# Patient Record
Sex: Female | Born: 1950 | Race: White | Hispanic: No | Smoking: Former smoker
Health system: Southern US, Community
[De-identification: ages and names within clinical notes are randomized; demographics above are authoritative.]

## PROBLEM LIST (undated history)

## (undated) DIAGNOSIS — D126 Benign neoplasm of colon, unspecified: Secondary | ICD-10-CM

## (undated) DIAGNOSIS — E559 Vitamin D deficiency, unspecified: Secondary | ICD-10-CM

## (undated) DIAGNOSIS — E785 Hyperlipidemia, unspecified: Secondary | ICD-10-CM

## (undated) DIAGNOSIS — I10 Essential (primary) hypertension: Secondary | ICD-10-CM

## (undated) HISTORY — DX: Benign neoplasm of colon, unspecified: D12.6

## (undated) HISTORY — DX: Essential (primary) hypertension: I10

## (undated) HISTORY — DX: Vitamin D deficiency, unspecified: E55.9

## (undated) HISTORY — DX: Hyperlipidemia, unspecified: E78.5

## (undated) HISTORY — PX: TUBAL LIGATION: SHX77

## (undated) HISTORY — PX: BREAST BIOPSY: SHX20

---

## 1997-12-11 ENCOUNTER — Ambulatory Visit (HOSPITAL_COMMUNITY): Admission: RE | Admit: 1997-12-11 | Discharge: 1997-12-11 | Payer: Self-pay | Admitting: Obstetrics and Gynecology

## 1999-01-28 ENCOUNTER — Ambulatory Visit (HOSPITAL_COMMUNITY): Admission: RE | Admit: 1999-01-28 | Discharge: 1999-01-28 | Payer: Self-pay | Admitting: Obstetrics and Gynecology

## 1999-03-18 ENCOUNTER — Other Ambulatory Visit: Admission: RE | Admit: 1999-03-18 | Discharge: 1999-03-18 | Payer: Self-pay | Admitting: Obstetrics and Gynecology

## 2000-02-02 ENCOUNTER — Encounter: Payer: Self-pay | Admitting: Obstetrics and Gynecology

## 2000-02-02 ENCOUNTER — Ambulatory Visit (HOSPITAL_COMMUNITY): Admission: RE | Admit: 2000-02-02 | Discharge: 2000-02-02 | Payer: Self-pay | Admitting: Obstetrics and Gynecology

## 2000-03-23 ENCOUNTER — Other Ambulatory Visit: Admission: RE | Admit: 2000-03-23 | Discharge: 2000-03-23 | Payer: Self-pay | Admitting: Obstetrics and Gynecology

## 2001-02-08 ENCOUNTER — Encounter: Payer: Self-pay | Admitting: Obstetrics and Gynecology

## 2001-02-08 ENCOUNTER — Ambulatory Visit (HOSPITAL_COMMUNITY): Admission: RE | Admit: 2001-02-08 | Discharge: 2001-02-08 | Payer: Self-pay | Admitting: Obstetrics and Gynecology

## 2001-04-05 ENCOUNTER — Other Ambulatory Visit: Admission: RE | Admit: 2001-04-05 | Discharge: 2001-04-05 | Payer: Self-pay | Admitting: Obstetrics and Gynecology

## 2001-09-05 ENCOUNTER — Emergency Department (HOSPITAL_COMMUNITY): Admission: EM | Admit: 2001-09-05 | Discharge: 2001-09-05 | Payer: Self-pay | Admitting: Emergency Medicine

## 2001-09-05 ENCOUNTER — Encounter: Payer: Self-pay | Admitting: Emergency Medicine

## 2002-03-14 ENCOUNTER — Ambulatory Visit (HOSPITAL_COMMUNITY): Admission: RE | Admit: 2002-03-14 | Discharge: 2002-03-14 | Payer: Self-pay | Admitting: Obstetrics and Gynecology

## 2002-03-14 ENCOUNTER — Encounter: Payer: Self-pay | Admitting: Obstetrics and Gynecology

## 2002-04-11 ENCOUNTER — Other Ambulatory Visit: Admission: RE | Admit: 2002-04-11 | Discharge: 2002-04-11 | Payer: Self-pay | Admitting: Obstetrics and Gynecology

## 2002-08-07 ENCOUNTER — Ambulatory Visit (HOSPITAL_COMMUNITY): Admission: RE | Admit: 2002-08-07 | Discharge: 2002-08-07 | Payer: Self-pay | Admitting: Gastroenterology

## 2002-08-07 ENCOUNTER — Encounter (INDEPENDENT_AMBULATORY_CARE_PROVIDER_SITE_OTHER): Payer: Self-pay | Admitting: Specialist

## 2003-05-21 ENCOUNTER — Encounter: Payer: Self-pay | Admitting: Obstetrics and Gynecology

## 2003-05-21 ENCOUNTER — Ambulatory Visit (HOSPITAL_COMMUNITY): Admission: RE | Admit: 2003-05-21 | Discharge: 2003-05-21 | Payer: Self-pay | Admitting: Obstetrics and Gynecology

## 2003-05-22 ENCOUNTER — Other Ambulatory Visit: Admission: RE | Admit: 2003-05-22 | Discharge: 2003-05-22 | Payer: Self-pay | Admitting: Obstetrics and Gynecology

## 2003-07-02 ENCOUNTER — Encounter: Admission: RE | Admit: 2003-07-02 | Discharge: 2003-07-02 | Payer: Self-pay | Admitting: Obstetrics and Gynecology

## 2003-07-02 ENCOUNTER — Encounter: Payer: Self-pay | Admitting: Obstetrics and Gynecology

## 2004-06-03 ENCOUNTER — Ambulatory Visit (HOSPITAL_COMMUNITY): Admission: RE | Admit: 2004-06-03 | Discharge: 2004-06-03 | Payer: Self-pay | Admitting: Obstetrics and Gynecology

## 2004-09-23 ENCOUNTER — Other Ambulatory Visit: Admission: RE | Admit: 2004-09-23 | Discharge: 2004-09-23 | Payer: Self-pay | Admitting: Obstetrics and Gynecology

## 2005-06-29 ENCOUNTER — Ambulatory Visit (HOSPITAL_COMMUNITY): Admission: RE | Admit: 2005-06-29 | Discharge: 2005-06-29 | Payer: Self-pay | Admitting: *Deleted

## 2006-02-16 ENCOUNTER — Other Ambulatory Visit: Admission: RE | Admit: 2006-02-16 | Discharge: 2006-02-16 | Payer: Self-pay | Admitting: Obstetrics & Gynecology

## 2006-07-12 ENCOUNTER — Encounter: Admission: RE | Admit: 2006-07-12 | Discharge: 2006-07-12 | Payer: Self-pay | Admitting: Obstetrics and Gynecology

## 2007-03-15 ENCOUNTER — Other Ambulatory Visit: Admission: RE | Admit: 2007-03-15 | Discharge: 2007-03-15 | Payer: Self-pay | Admitting: Obstetrics & Gynecology

## 2007-07-18 ENCOUNTER — Encounter: Admission: RE | Admit: 2007-07-18 | Discharge: 2007-07-18 | Payer: Self-pay | Admitting: Obstetrics and Gynecology

## 2008-05-15 ENCOUNTER — Other Ambulatory Visit: Admission: RE | Admit: 2008-05-15 | Discharge: 2008-05-15 | Payer: Self-pay | Admitting: Obstetrics and Gynecology

## 2008-07-23 ENCOUNTER — Encounter: Admission: RE | Admit: 2008-07-23 | Discharge: 2008-07-23 | Payer: Self-pay | Admitting: Obstetrics and Gynecology

## 2009-07-29 ENCOUNTER — Ambulatory Visit (HOSPITAL_COMMUNITY): Admission: RE | Admit: 2009-07-29 | Discharge: 2009-07-29 | Payer: Self-pay | Admitting: Obstetrics and Gynecology

## 2010-08-04 ENCOUNTER — Ambulatory Visit (HOSPITAL_COMMUNITY): Admission: RE | Admit: 2010-08-04 | Discharge: 2010-08-04 | Payer: Self-pay | Admitting: Obstetrics and Gynecology

## 2011-02-23 ENCOUNTER — Other Ambulatory Visit: Payer: Self-pay | Admitting: Obstetrics and Gynecology

## 2011-02-23 DIAGNOSIS — Z780 Asymptomatic menopausal state: Secondary | ICD-10-CM

## 2011-03-10 ENCOUNTER — Ambulatory Visit (HOSPITAL_COMMUNITY)
Admission: RE | Admit: 2011-03-10 | Discharge: 2011-03-10 | Disposition: A | Discharge status: Home / Self Care | Payer: BC Managed Care – PPO | Source: Ambulatory Visit | Attending: Obstetrics and Gynecology | Admitting: Obstetrics and Gynecology

## 2011-03-10 ENCOUNTER — Ambulatory Visit (HOSPITAL_COMMUNITY): Payer: BC Managed Care – PPO

## 2011-03-10 DIAGNOSIS — Z13820 Encounter for screening for osteoporosis: Secondary | ICD-10-CM | POA: Insufficient documentation

## 2011-03-10 DIAGNOSIS — Z780 Asymptomatic menopausal state: Secondary | ICD-10-CM | POA: Insufficient documentation

## 2011-03-10 LAB — HM DEXA SCAN

## 2011-03-24 NOTE — Op Note (Signed)
   NAME:  Brittany Ward, Brittany Ward                       ACCOUNT NO.:  [id number]   MEDICAL RECORD NO.:  [id number]                    PATIENT TYPE:   LOCATION:                                       FACILITY:   PHYSICIAN:  Anselmo Rod, M.D.               DATE OF BIRTH:  1951-01-22   DATE OF PROCEDURE:  08/07/2002  DATE OF DISCHARGE:                                 OPERATIVE REPORT   PROCEDURE PERFORMED:  Colonoscopy with snare polypectomy x1.   ENDOSCOPIST:  Anselmo Rod, M.D.   INSTRUMENT USED:  Olympus video colonoscope.   INDICATIONS FOR PROCEDURE:  A 61 year old white female underwent screening  colonoscopy.  The patient's mother had colon cancer.  Rule out colonic  polyps, masses, hemorrhoids, etc.   PREPROCEDURE PREPARATION:  Informed consent was received from the patient.  The patient was fasting eight hours prior to the procedure and prepped with  a bottle of magnesium citrate and a gallon of NuLytely drank prior to the  procedure.   PREPROCEDURE PHYSICAL:  VITAL SIGNS:  The patient had stable vital signs.  NECK:  Supple.  CHEST:  Clear to auscultation.  HEART:  S1, S2.  ABDOMEN:  Soft with normal bowel sounds.   DESCRIPTION OF PROCEDURE:  The patient was placed in left lateral decubitus  position and sedated with 60 mg of Demerol and 6 mg of Versed intravenously.  Once the patient was adequately sedated and maintained on low flow oxygen  and continuous cardiac monitoring, the Olympus video colonoscope was  advanced from the rectum to the cecum and terminal ileum without difficulty.  Two small sessile polyps were biopsied at 20 cm; a 5 to 6 mm polyp was  snared at 20 cm at well.  The rest of colonic mucosa to terminal ileum  appeared normal.   IMPRESSION:  1. Three colonic polyps removed.  See description above.  2. Normal-appearing proximal left colon, transverse colon, right colon,     cecum and terminal ileum.   RECOMMENDATIONS:  1. Avoid all nonsteroidals  including aspirin for now.  2.     Await pathology results.  3. Repeat colorectal cancer screening depending on the pathology results.  4. Outpatient followup in the next 2 weeks, earlier if need be.                                               Anselmo Rod, M.D.    JNM/MEDQ  D:  08/07/2002  T:  08/09/2002  Job:  [id number]   cc:   Laqueta Linden, M.D.  [address], [address]  [place]  [place] [address]  Fax: [phone]

## 2011-08-16 ENCOUNTER — Other Ambulatory Visit: Payer: Self-pay | Admitting: Obstetrics and Gynecology

## 2011-08-16 DIAGNOSIS — Z1231 Encounter for screening mammogram for malignant neoplasm of breast: Secondary | ICD-10-CM

## 2011-09-01 ENCOUNTER — Ambulatory Visit (HOSPITAL_COMMUNITY)
Admission: RE | Admit: 2011-09-01 | Discharge: 2011-09-01 | Disposition: A | Discharge status: Home / Self Care | Payer: BC Managed Care – PPO | Source: Ambulatory Visit | Attending: Obstetrics and Gynecology | Admitting: Obstetrics and Gynecology

## 2011-09-01 DIAGNOSIS — Z1231 Encounter for screening mammogram for malignant neoplasm of breast: Secondary | ICD-10-CM

## 2011-11-24 LAB — HM PAP SMEAR: HM Pap smear: NEGATIVE

## 2012-09-11 ENCOUNTER — Other Ambulatory Visit: Payer: Self-pay | Admitting: Obstetrics and Gynecology

## 2012-09-11 DIAGNOSIS — Z1231 Encounter for screening mammogram for malignant neoplasm of breast: Secondary | ICD-10-CM

## 2012-10-04 ENCOUNTER — Ambulatory Visit (HOSPITAL_COMMUNITY)
Admission: RE | Admit: 2012-10-04 | Discharge: 2012-10-04 | Disposition: A | Discharge status: Home / Self Care | Payer: BC Managed Care – PPO | Source: Ambulatory Visit | Attending: Obstetrics and Gynecology | Admitting: Obstetrics and Gynecology

## 2012-10-04 DIAGNOSIS — Z1231 Encounter for screening mammogram for malignant neoplasm of breast: Secondary | ICD-10-CM | POA: Insufficient documentation

## 2013-10-01 ENCOUNTER — Other Ambulatory Visit (HOSPITAL_COMMUNITY): Payer: Self-pay | Admitting: Obstetrics and Gynecology

## 2013-10-01 DIAGNOSIS — Z1231 Encounter for screening mammogram for malignant neoplasm of breast: Secondary | ICD-10-CM

## 2013-10-16 ENCOUNTER — Ambulatory Visit (HOSPITAL_COMMUNITY)
Admission: RE | Admit: 2013-10-16 | Discharge: 2013-10-16 | Disposition: A | Discharge status: Home / Self Care | Payer: BC Managed Care – PPO | Source: Ambulatory Visit | Attending: Obstetrics and Gynecology | Admitting: Obstetrics and Gynecology

## 2013-10-16 DIAGNOSIS — Z1231 Encounter for screening mammogram for malignant neoplasm of breast: Secondary | ICD-10-CM

## 2013-12-04 ENCOUNTER — Encounter: Payer: Self-pay | Admitting: Nurse Practitioner

## 2013-12-05 ENCOUNTER — Encounter: Payer: Self-pay | Admitting: Nurse Practitioner

## 2013-12-05 ENCOUNTER — Ambulatory Visit (INDEPENDENT_AMBULATORY_CARE_PROVIDER_SITE_OTHER): Payer: BC Managed Care – PPO | Admitting: Nurse Practitioner

## 2013-12-05 VITALS — BP 134/86 | HR 84 | Ht 63.75 in | Wt 219.0 lb

## 2013-12-05 DIAGNOSIS — F4321 Adjustment disorder with depressed mood: Secondary | ICD-10-CM | POA: Insufficient documentation

## 2013-12-05 DIAGNOSIS — Z01419 Encounter for gynecological examination (general) (routine) without abnormal findings: Secondary | ICD-10-CM

## 2013-12-05 DIAGNOSIS — N952 Postmenopausal atrophic vaginitis: Secondary | ICD-10-CM

## 2013-12-05 DIAGNOSIS — E559 Vitamin D deficiency, unspecified: Secondary | ICD-10-CM | POA: Insufficient documentation

## 2013-12-05 DIAGNOSIS — I10 Essential (primary) hypertension: Secondary | ICD-10-CM | POA: Insufficient documentation

## 2013-12-05 DIAGNOSIS — Z800 Family history of malignant neoplasm of digestive organs: Secondary | ICD-10-CM

## 2013-12-05 MED ORDER — ESTRADIOL 2 MG VA RING: 2.0000 mg | VAGINAL_RING | VAGINAL | Status: DC

## 2013-12-05 MED ORDER — VITAMIN D (ERGOCALCIFEROL) 1.25 MG (50000 UNIT) PO CAPS: 50000.0000 [IU] | ORAL_CAPSULE | ORAL | Status: DC

## 2013-12-05 NOTE — Patient Instructions (Signed)

## 2013-12-05 NOTE — Progress Notes (Signed)
Reviewed personally.  M. Suzanne Abad Manard, MD.  

## 2013-12-05 NOTE — Progress Notes (Signed)
Patient ID: Brittany Ward, female   DOB: [birth year], 63 y.o.   MRN: [id number] 63 y.o. G2P2002 Divorced Caucasian Fe here for annual exam.  She is still suffering from the loss of her partner of 20 years who died of liver disease in 08-09-2012.  Still using Estring and doing well.  Has a new PCP since Sima Matas PA has retired and will be getting labs there.  Patient's last menstrual period was 02/10/1997.          Sexually active: no  The current method of family planning is none - postmenopausal Exercising: no  The patient does not participate in regular exercise at present. Smoker:  Yes, 1/2 ppd  Health Maintenance: Pap:  11/24/11, WNL, neg HR HPV MMG:  10/16/13, Bi-Rads 1: negative Colonoscopy:  11/2008, polyp, repeat in 5 years BMD:  03/2011, -0.7/-1.4/-1.5 TDaP:  04/25/06 Labs:  PCP Urine:  PCP    reports that she has been smoking Cigarettes.  She has been smoking about 0.50 packs per day. She has never used smokeless tobacco. She reports that she drinks about 0.5 ounces of alcohol per week. She reports that she does not use illicit drugs.  Past Medical History  Diagnosis Date  . Vitamin D deficiency   . Hypertension   . Hyperlipidemia     Past Surgical History  Procedure Laterality Date  . Cesarean section      x2    Current Outpatient Prescriptions  Medication Sig Dispense Refill  . aspirin EC 81 MG tablet Take 81 mg by mouth daily.      . Calcium Carbonate-Vit D-Min (CALCIUM 1200 PO) Take 1 tablet by mouth daily.      Marland Kitchen desipramine (NORPRAMIN) 50 MG tablet Take 50 mg by mouth daily.      Marland Kitchen esomeprazole (NEXIUM) 40 MG capsule Take 40 mg by mouth daily at 12 noon.      Marland Kitchen estradiol (ESTRING) 2 MG vaginal ring Place 2 mg vaginally every 3 (three) months. follow package directions  1 each  3  . ezetimibe (ZETIA) 10 MG tablet Take 10 mg by mouth daily.      . hydrochlorothiazide (HYDRODIURIL) 25 MG tablet Take 25 mg by mouth daily.      Marland Kitchen losartan (COZAAR) 50 MG tablet  Take 50 mg by mouth daily.      . Multiple Vitamin (MULTIVITAMIN) tablet Take 1 tablet by mouth daily.      . Vitamin D, Ergocalciferol, (DRISDOL) 50000 UNITS CAPS capsule Take 1 capsule (50,000 Units total) by mouth every 14 (fourteen) days.  30 capsule  3   No current facility-administered medications for this visit.    Family History  Problem Relation Age of Onset  . Colon cancer Mother   . Pancreatic cancer Brother   . Leukemia Maternal Grandfather   . Breast cancer Cousin     ROS:  Pertinent items are noted in HPI.  Otherwise, a comprehensive ROS was negative.  Exam:   BP 134/86  Pulse 84  Ht 5' 3.75" (1.619 m)  Wt 219 lb (99.338 kg)  BMI 37.90 kg/m2  LMP 02/10/1997 Height: 5' 3.75" (161.9 cm)  Ht Readings from Last 3 Encounters:  12/05/13 5' 3.75" (1.619 m)    General appearance: alert, cooperative and appears stated age Head: Normocephalic, without obvious abnormality, atraumatic Neck: no adenopathy, supple, symmetrical, trachea midline and thyroid normal to inspection and palpation Lungs: clear to auscultation bilaterally Breasts: normal appearance, no masses or tenderness Heart: regular  rate and rhythm Abdomen: soft, non-tender; no masses,  no organomegaly Extremities: extremities normal, atraumatic, no cyanosis or edema Skin: Skin color, texture, turgor normal. No rashes or lesions Lymph nodes: Cervical, supraclavicular, and axillary nodes normal. No abnormal inguinal nodes palpated Neurologic: Grossly normal   Pelvic: External genitalia:  no lesions              Urethra:  normal appearing urethra with no masses, tenderness or lesions              Bartholin's and Skene's: normal                 Vagina: normal appearing vagina with normal color and discharge, no lesions              Cervix: anteverted              Pap taken: no Bimanual Exam:  Uterus:  normal size, contour, position, consistency, mobility, non-tender              Adnexa: no mass, fullness,  tenderness               Rectovaginal: Confirms               Anus:  normal sphincter tone, no lesions  A:  Well Woman with normal exam  Postmenopausal - off HRT 08/2001  HTN  [place]: colon and pancreatic cancer  P:   Pap smear as per guidelines   Mammogram is due 12/15  Refill on Estring for a year - she is aware of potential risk of Estring with DVT, CVA,cancer, etc.  Counseled with exercise, diet, calcium, vit D and staying healthy.  Encourage outside activities and stay connected with family. return annually or prn  An After Visit Summary was printed and given to the patient.

## 2013-12-06 LAB — VITAMIN D 25 HYDROXY (VIT D DEFICIENCY, FRACTURES): VIT D 25 HYDROXY: 55 ng/mL (ref 30–89)

## 2013-12-08 ENCOUNTER — Telehealth: Payer: Self-pay | Admitting: *Deleted

## 2013-12-08 NOTE — Telephone Encounter (Signed)
I have attempted to contact this patient by phone with the following results: left message to return my call on answering machine (home per [facility]).

## 2013-12-08 NOTE — Telephone Encounter (Signed)
Message copied by Graylon Good on Mon Dec 08, 2013 11:40 AM ------      Message from: Kem Boroughs R      Created: Mon Dec 08, 2013  9:14 AM       Let patient know results and follow protocol.  By mistake she was on Vit D weekly instead of every 2 weeks. She can go back to every other week.  On OTC her Vit D goes down. ------

## 2013-12-09 NOTE — Telephone Encounter (Signed)
I have attempted to contact this patient by phone with the following results: left message to return my call on answering machine (home per [facility]).  

## 2013-12-10 NOTE — Telephone Encounter (Signed)
Pt is returning a call to [place]. Pt states she will call back tomorrow tom talk with Colletta [place].

## 2013-12-11 NOTE — Telephone Encounter (Signed)
Patient is returning call to stephanie. 

## 2013-12-11 NOTE — Telephone Encounter (Signed)
Pt notified in result note.  

## 2014-05-01 ENCOUNTER — Other Ambulatory Visit: Payer: Self-pay | Admitting: Nurse Practitioner

## 2014-05-01 NOTE — Telephone Encounter (Signed)
Last AEX: 12/05/13 Last refill:12/05/13, # 26, 0 refills Current AEX: Filled until the pt next AEX is due  Closed encounter

## 2014-09-07 ENCOUNTER — Encounter: Payer: Self-pay | Admitting: Nurse Practitioner

## 2014-10-20 ENCOUNTER — Other Ambulatory Visit: Payer: Self-pay | Admitting: Obstetrics & Gynecology

## 2014-10-20 DIAGNOSIS — Z1231 Encounter for screening mammogram for malignant neoplasm of breast: Secondary | ICD-10-CM

## 2014-10-23 ENCOUNTER — Ambulatory Visit (HOSPITAL_COMMUNITY)
Admission: RE | Admit: 2014-10-23 | Discharge: 2014-10-23 | Disposition: A | Discharge status: Home / Self Care | Payer: BC Managed Care – PPO | Source: Ambulatory Visit | Attending: Obstetrics & Gynecology | Admitting: Obstetrics & Gynecology

## 2014-10-23 DIAGNOSIS — Z1231 Encounter for screening mammogram for malignant neoplasm of breast: Secondary | ICD-10-CM | POA: Diagnosis present

## 2014-12-07 ENCOUNTER — Other Ambulatory Visit: Payer: Self-pay | Admitting: Nurse Practitioner

## 2014-12-07 NOTE — Telephone Encounter (Signed)
Medication refill request: Vitamin D 50,000 Last AEX:  12/05/13 Next AEX: 12/25/14 Last MMG (if hormonal medication request): 10/23/14 BIRADS1:neg Refill authorized: 05/01/14 #26/0R. Today #26/0R?

## 2014-12-25 ENCOUNTER — Encounter: Payer: Self-pay | Admitting: Nurse Practitioner

## 2014-12-25 ENCOUNTER — Ambulatory Visit (INDEPENDENT_AMBULATORY_CARE_PROVIDER_SITE_OTHER): Payer: 59 | Admitting: Nurse Practitioner

## 2014-12-25 VITALS — BP 112/72 | HR 64 | Ht 64.0 in | Wt 220.0 lb

## 2014-12-25 DIAGNOSIS — Z0000 Encounter for general adult medical examination without abnormal findings: Secondary | ICD-10-CM

## 2014-12-25 DIAGNOSIS — M8580 Other specified disorders of bone density and structure, unspecified site: Secondary | ICD-10-CM

## 2014-12-25 DIAGNOSIS — E559 Vitamin D deficiency, unspecified: Secondary | ICD-10-CM

## 2014-12-25 DIAGNOSIS — Z01419 Encounter for gynecological examination (general) (routine) without abnormal findings: Secondary | ICD-10-CM

## 2014-12-25 LAB — HEPATIC FUNCTION PANEL
ALBUMIN: 4.5 g/dL (ref 3.5–5.2)
ALT: 27 U/L (ref 0–35)
AST: 25 U/L (ref 0–37)
Alkaline Phosphatase: 78 U/L (ref 39–117)
BILIRUBIN TOTAL: 0.4 mg/dL (ref 0.2–1.2)
Bilirubin, Direct: 0.1 mg/dL (ref 0.0–0.3)
Indirect Bilirubin: 0.3 mg/dL (ref 0.2–1.2)
Total Protein: 7.4 g/dL (ref 6.0–8.3)

## 2014-12-25 MED ORDER — ESTRADIOL 2 MG VA RING: 2.0000 mg | VAGINAL_RING | VAGINAL | Status: DC

## 2014-12-25 NOTE — Progress Notes (Signed)
Patient ID: Brittany Ward, female   DOB: 07/06/1951, 64 y.o.   MRN: [id number] 63 y.o. G2P2002 Divorced  Caucasian Fe here for annual exam.    Patient's last menstrual period was 02/10/1997.  Pt is post menopausal.  Sexually active: No.  The current method of family planning is none and post menopausal status.    Exercising: No.  The patient does not participate in regular exercise at present. Smoker:  Yes, 1/2 ppd  Health Maintenance: Pap:  11/24/11, negative with neg HR HPV MMG:  10/23/14, 3D, Bi-Rads 1:  Negative  Colonoscopy:  06/2014, normal, repeat in 10 years BMD:   03/10/11, -0.7/-1.4/-1.5 TDaP:  04/25/06 Labs:  PCP takes care of all labs   reports that she has been smoking Cigarettes.  She has been smoking about 0.50 packs per day. She has never used smokeless tobacco. She reports that she drinks about 0.5 - 1.0 oz of alcohol per week. She reports that she does not use illicit drugs.  Past Medical History  Diagnosis Date  . Vitamin D deficiency   . Hypertension   . Hyperlipidemia     Past Surgical History  Procedure Laterality Date  . Cesarean section      x2    Current Outpatient Prescriptions  Medication Sig Dispense Refill  . aspirin EC 81 MG tablet Take 81 mg by mouth daily.    . Calcium Carbonate-Vit D-Min (CALCIUM 1200 PO) Take 1 tablet by mouth daily.    Marland Kitchen desipramine (NORPRAMIN) 50 MG tablet Take 50 mg by mouth daily.    Marland Kitchen esomeprazole (NEXIUM) 40 MG capsule Take 40 mg by mouth daily at 12 noon.    Marland Kitchen estradiol (ESTRING) 2 MG vaginal ring Place 2 mg vaginally every 3 (three) months. follow package directions 1 each 3  . ezetimibe (ZETIA) 10 MG tablet Take 10 mg by mouth daily.    . hydrochlorothiazide (HYDRODIURIL) 25 MG tablet Take 25 mg by mouth daily.    Marland Kitchen losartan (COZAAR) 50 MG tablet Take 50 mg by mouth daily.    . Multiple Vitamin (MULTIVITAMIN) tablet Take 1 tablet by mouth daily.    . Vitamin D, Ergocalciferol, (DRISDOL) 50000 UNITS CAPS capsule TAKE 1  CAPSULE BY MOUTH EVERY 14 DAYS 30 capsule 0   No current facility-administered medications for this visit.    Family History  Problem Relation Age of Onset  . Colon cancer Mother   . Pancreatic cancer Brother   . Leukemia Maternal Grandfather   . Breast cancer Cousin     ROS:  Pertinent items are noted in HPI.  Otherwise, a comprehensive ROS was negative.  Exam:   BP 112/72 mmHg  Pulse 64  Ht 5\' 4"  (1.626 m)  Wt 220 lb (99.791 kg)  BMI 37.74 kg/m2  LMP 02/10/1997 Height: 5\' 4"  (162.6 cm) Ht Readings from Last 3 Encounters:  12/25/14 5\' 4"  (1.626 m)  12/05/13 5' 3.75" (1.619 m)    General appearance: alert, cooperative and appears stated age Head: Normocephalic, without obvious abnormality, atraumatic Neck: no adenopathy, supple, symmetrical, trachea midline and thyroid normal to inspection and palpation Lungs: clear to auscultation bilaterally Breasts: normal appearance, no masses or tenderness Heart: regular rate and rhythm Abdomen: soft, non-tender; no masses,  no organomegaly Extremities: extremities normal, atraumatic, no cyanosis or edema Skin: Skin color, texture, turgor normal. No rashes or lesions Lymph nodes: Cervical, supraclavicular, and axillary nodes normal. No abnormal inguinal nodes palpated Neurologic: Grossly normal   Pelvic: External genitalia:  no lesions              Urethra:  normal appearing urethra with no masses, tenderness or lesions              Bartholin's and Skene's: normal                 Vagina: normal appearing vagina with normal color and discharge, no lesions              Cervix: anteverted              Pap taken: No. Bimanual Exam:  Uterus:  normal size, contour, position, consistency, mobility, non-tender              Adnexa: no mass, fullness, tenderness               Rectovaginal: Confirms               Anus:  normal sphincter tone, no lesions  Chaperone present:  no  A:  Well Woman with normal exam  Postmenopausal - off HRT  08/2001 HTN [place]: colon and pancreatic cancer  Atrophic vaginitis - on Estring   P:   Reviewed health and wellness pertinent to exam  Pap smear taken today  Mammogram is due 12/16  Refill Estring with coupon for a year  Counseled with risk of CVA,DVT, cancer, etc  Will get liver test and Vit D - rest of labs done at PCP.  Counseled on breast self exam, mammography screening, use and side effects of HRT, adequate intake of calcium and vitamin D, diet and exercise, Kegel's exercises return annually or prn  An After Visit Summary was printed and given to the patient.

## 2014-12-25 NOTE — Patient Instructions (Signed)

## 2014-12-26 LAB — VITAMIN D 25 HYDROXY (VIT D DEFICIENCY, FRACTURES): Vit D, 25-Hydroxy: 38 ng/mL (ref 30–100)

## 2014-12-29 ENCOUNTER — Telehealth: Payer: Self-pay | Admitting: *Deleted

## 2014-12-29 MED ORDER — VITAMIN D (ERGOCALCIFEROL) 1.25 MG (50000 UNIT) PO CAPS: 50000.0000 [IU] | ORAL_CAPSULE | ORAL | Status: DC

## 2014-12-29 NOTE — Addendum Note (Signed)
Addended by: Graylon Good on: 12/29/2014 01:51 PM   Modules accepted: Orders, SmartSet

## 2014-12-29 NOTE — Progress Notes (Signed)
Encounter reviewed by Dr. Keigen Caddell Silva.  

## 2014-12-29 NOTE — Telephone Encounter (Signed)
I have attempted to contact this patient by phone with the following results: left message to return call to [place] at [phone] on answering machine (mobile per [facility]).  Pt name verified in voicemail, advised call was regarding recent labs.  [phone] (Mobile)

## 2014-12-29 NOTE — Telephone Encounter (Signed)
-----   Message from Milford Cage, [place] sent at 12/28/2014  8:05 AM EST ----- Let patient know that hepatic function test were all normal.  The Vit D level went from 55 to 38 but to continue on Vit D RX at every other week.

## 2015-01-15 NOTE — Telephone Encounter (Signed)
Pt notified in result note on 12/29/14.  Closing encounter.

## 2015-08-18 ENCOUNTER — Encounter: Payer: Self-pay | Admitting: Certified Nurse Midwife

## 2015-08-18 ENCOUNTER — Ambulatory Visit (INDEPENDENT_AMBULATORY_CARE_PROVIDER_SITE_OTHER): Payer: 59 | Admitting: Certified Nurse Midwife

## 2015-08-18 VITALS — BP 118/68 | HR 72 | Temp 98.5°F | Resp 16 | Ht 64.0 in | Wt 215.0 lb

## 2015-08-18 DIAGNOSIS — M545 Low back pain, unspecified: Secondary | ICD-10-CM

## 2015-08-18 DIAGNOSIS — N390 Urinary tract infection, site not specified: Secondary | ICD-10-CM

## 2015-08-18 LAB — CBC WITH DIFFERENTIAL/PLATELET
BASOS ABS: 0 10*3/uL (ref 0.0–0.1)
Basophils Relative: 0 % (ref 0–1)
EOS PCT: 3 % (ref 0–5)
Eosinophils Absolute: 0.4 10*3/uL (ref 0.0–0.7)
HCT: 42.5 % (ref 36.0–46.0)
Hemoglobin: 14.4 g/dL (ref 12.0–15.0)
LYMPHS ABS: 3.6 10*3/uL (ref 0.7–4.0)
Lymphocytes Relative: 29 % (ref 12–46)
MCH: 29.6 pg (ref 26.0–34.0)
MCHC: 33.9 g/dL (ref 30.0–36.0)
MCV: 87.4 fL (ref 78.0–100.0)
MPV: 9.4 fL (ref 8.6–12.4)
Monocytes Absolute: 0.6 10*3/uL (ref 0.1–1.0)
Monocytes Relative: 5 % (ref 3–12)
NEUTROS PCT: 63 % (ref 43–77)
Neutro Abs: 7.9 10*3/uL — ABNORMAL HIGH (ref 1.7–7.7)
PLATELETS: 298 10*3/uL (ref 150–400)
RBC: 4.86 MIL/uL (ref 3.87–5.11)
RDW: 14.1 % (ref 11.5–15.5)
WBC: 12.5 10*3/uL — AB (ref 4.0–10.5)

## 2015-08-18 LAB — POCT URINALYSIS DIPSTICK
Bilirubin, UA: NEGATIVE
Blood, UA: NEGATIVE
Glucose, UA: NEGATIVE
Ketones, UA: NEGATIVE
Leukocytes, UA: NEGATIVE
Nitrite, UA: NEGATIVE
Protein, UA: NEGATIVE
UROBILINOGEN UA: NEGATIVE
pH, UA: 5

## 2015-08-18 NOTE — Patient Instructions (Signed)

## 2015-08-18 NOTE — Progress Notes (Signed)
64 y.o.Divorced white female g2p 2002 here with complaint of lower right back pain that is coming around to the groin area. Noticed this am and radiating down into groin area. 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 Has had UTI in past and this is not the same thing.. Patient denies of urinary frequency/urgency/ and pain with urination. Patient denies fever, chills, nausea or vomiting. Noticed some low back pain last pm.. Patient feels increase pain with standing, but better when sitting.  . Denies any vaginal symptom . No history of back issues or kidney stones. Patient consuming adequate water intake in the past 24 hours. Patient not sure if this is a muscle problem, with sudden onset of pain and not resolved. Pain has stopped now, just slight dull ache in lower back to right. "Feels like it has gone away". No other concerns today.  O: Healthy female WDWN Affect: Normal, orientation x 3 Skin : warm and dry Heart: NSSR no murmurs heard Lungs: clear CVAT: negative bilateral Lower back on right pointed to area of pain previous, but non tender now Abdomen: negative for suprapubic tenderness, non tender, soft, no rebound, no point of pain noted in groin area Inguinal lymph nodes not tender, no enlargement  Pelvic exam: External genital area: normal, no lesions Bladder,Urethra tender, Urethral meatus: tender, red Vagina: normal vaginal discharge, normal appearance  Wet prep not taken Cervix: normal, non tender Uterus:normal,non tender Adnexa: normal non tender, no fullness or masses   A: Sudden onset back pain at work, which now has resolved/resolving ? Muscle spasm Normal Abdominal/ pelvic exam Poct urine-neg  P: Reviewed findings of exam. Discussed possible muscle orientation with sudden onset with motion at work. Suggest Motrin 800 mg for discomfort if reoccurs, or ice to area. Advise if pain increases or changes location. Do not feel gyn related at  this point. Warning signs of UTI,kidney stone reviewed, due history of UTI CHE:NIDPO, culture, CBC with diff. Patient advised to call if pain reoccurs.   RV prn

## 2015-08-19 ENCOUNTER — Telehealth: Payer: Self-pay

## 2015-08-19 LAB — URINE CULTURE
Colony Count: NO GROWTH
ORGANISM ID, BACTERIA: NO GROWTH

## 2015-08-19 NOTE — Progress Notes (Signed)
Encounter reviewed Jill Jertson, MD   

## 2015-08-19 NOTE — Telephone Encounter (Signed)
For joy

## 2015-08-19 NOTE — Telephone Encounter (Signed)
-----   Message from Regina Eck, CNM sent at 08/19/2015 10:54 AM EDT ----- Notify patient that WBC count slightly elevated at 12.5  And very slight elevation of neutrophils at 7.9.  Urine culture pending. WBC needs to be rechecked on Monday please schedule or she can do with PCP.( she related it had been elevated before?) Advise if doing here so order can be put in. Patient status

## 2015-08-19 NOTE — Telephone Encounter (Signed)
Patient returning your call. Please call work number she is there until 4:30 # [phone]

## 2015-08-19 NOTE — Telephone Encounter (Signed)
lmtcb

## 2015-08-19 NOTE — Telephone Encounter (Signed)
Patient notified of results as written by provider 

## 2015-08-24 ENCOUNTER — Other Ambulatory Visit: Payer: 59

## 2015-08-24 ENCOUNTER — Telehealth: Payer: Self-pay | Admitting: Nurse Practitioner

## 2015-08-24 NOTE — Telephone Encounter (Signed)
Patient canceled her appointment for lab work today. She has gone to her primary for her lab work. She states they will be taking over now.

## 2015-08-24 NOTE — Telephone Encounter (Signed)
Routing to Kem Boroughs, FNP as Juluis Rainier. Will close encounter.

## 2015-10-13 ENCOUNTER — Other Ambulatory Visit: Payer: Self-pay

## 2015-10-13 DIAGNOSIS — Z1231 Encounter for screening mammogram for malignant neoplasm of breast: Secondary | ICD-10-CM

## 2015-10-29 ENCOUNTER — Ambulatory Visit
Admission: RE | Admit: 2015-10-29 | Discharge: 2015-10-29 | Disposition: A | Discharge status: Home / Self Care | Payer: 59 | Source: Ambulatory Visit

## 2015-10-29 DIAGNOSIS — Z1231 Encounter for screening mammogram for malignant neoplasm of breast: Secondary | ICD-10-CM

## 2015-11-30 ENCOUNTER — Telehealth: Payer: Self-pay | Admitting: Nurse Practitioner

## 2015-11-30 NOTE — Telephone Encounter (Signed)
Left message on voicemail to call and reschedule cancelled appointment. °

## 2015-12-31 ENCOUNTER — Ambulatory Visit: Payer: 59 | Admitting: Nurse Practitioner

## 2016-01-07 ENCOUNTER — Ambulatory Visit: Payer: Self-pay | Admitting: Nurse Practitioner

## 2016-02-25 ENCOUNTER — Encounter: Payer: Self-pay | Admitting: *Deleted

## 2016-02-25 ENCOUNTER — Other Ambulatory Visit: Payer: Self-pay | Admitting: Nurse Practitioner

## 2016-02-25 ENCOUNTER — Ambulatory Visit (INDEPENDENT_AMBULATORY_CARE_PROVIDER_SITE_OTHER): Payer: BLUE CROSS/BLUE SHIELD | Admitting: Nurse Practitioner

## 2016-02-25 VITALS — BP 124/72 | HR 64 | Ht 63.0 in | Wt 200.0 lb

## 2016-02-25 DIAGNOSIS — E2839 Other primary ovarian failure: Secondary | ICD-10-CM

## 2016-02-25 DIAGNOSIS — Z01419 Encounter for gynecological examination (general) (routine) without abnormal findings: Secondary | ICD-10-CM

## 2016-02-25 DIAGNOSIS — Z23 Encounter for immunization: Secondary | ICD-10-CM

## 2016-02-25 DIAGNOSIS — Z0000 Encounter for general adult medical examination without abnormal findings: Secondary | ICD-10-CM | POA: Diagnosis not present

## 2016-02-25 DIAGNOSIS — E559 Vitamin D deficiency, unspecified: Secondary | ICD-10-CM | POA: Diagnosis not present

## 2016-02-25 DIAGNOSIS — M8580 Other specified disorders of bone density and structure, unspecified site: Secondary | ICD-10-CM | POA: Diagnosis not present

## 2016-02-25 MED ORDER — VITAMIN D (ERGOCALCIFEROL) 1.25 MG (50000 UNIT) PO CAPS: 50000.0000 [IU] | ORAL_CAPSULE | ORAL | Status: DC

## 2016-02-25 MED ORDER — ESTRADIOL 2 MG VA RING: 2.0000 mg | VAGINAL_RING | VAGINAL | Status: DC

## 2016-02-25 NOTE — Patient Instructions (Signed)

## 2016-02-25 NOTE — Progress Notes (Signed)
Reviewed personally.  M. Suzanne Cherrill Scrima, MD.  

## 2016-02-25 NOTE — Progress Notes (Signed)
Patient ID: Brittany Ward, female   DOB: January 10, 1951, 65 y.o.   MRN: [id number]  65 y.o. G72P2002 Divorced  Caucasian Fe here for annual exam.  No new health issues.  Likes to continue with Estring.  It is time to come out today.      Patient's last menstrual period was 02/10/1997 (approximate).          Sexually active: No.  The current method of family planning is post menopausal status and bilateral tubal ligation.    Exercising: No.  The patient does not participate in regular exercise at present. Smoker:  Quit 11/2015  Health Maintenance: Pap: 11/24/11, Negative with neg HR HPV MMG: 10/29/15, Bi-Rads 1: Negative Colonoscopy: 7/24/ 2015, repeat in 10 years BMD:  03/2011 T Score: -0.7 Spine / -1.4 Right Femur Neck / -1.5 Left Femur Neck TDaP:  04/25/06 Shingles: never, did not have chicken pox Pneumonia: Not indicated due to age Hep C and HIV: drawn today Labs: PCP, pt brought copies (we follow Vit D)   reports that she has quit smoking. Her smoking use included Cigarettes. She smoked 0.50 packs per day. She has never used smokeless tobacco. She reports that she drinks about 0.6 - 1.2 oz of alcohol per week. She reports that she does not use illicit drugs.  Past Medical History  Diagnosis Date  . Vitamin D deficiency   . Hypertension   . Hyperlipidemia   . Tubular adenoma of colon 08/2002, 12 2006    Past Surgical History  Procedure Laterality Date  . Cesarean section      x2  . Tubal ligation Bilateral     Current Outpatient Prescriptions  Medication Sig Dispense Refill  . aspirin EC 81 MG tablet Take 81 mg by mouth daily.    . Calcium Carbonate-Vit D-Min (CALCIUM 1200 PO) Take 1 tablet by mouth daily.    . Cyanocobalamin (VITAMIN B 12 PO) Take by mouth daily. Vitamin B12    . desipramine (NORPRAMIN) 50 MG tablet Take 50 mg by mouth daily.    Marland Kitchen estradiol (ESTRING) 2 MG vaginal ring Place 2 mg vaginally every 3 (three) months. follow package directions 1 each 3  .  ezetimibe (ZETIA) 10 MG tablet Take 10 mg by mouth daily.    . hydrochlorothiazide (HYDRODIURIL) 25 MG tablet Take 25 mg by mouth daily.    Marland Kitchen losartan (COZAAR) 50 MG tablet Take 50 mg by mouth daily.    . Multiple Vitamin (MULTIVITAMIN) tablet Take 1 tablet by mouth daily.    . ranitidine (ZANTAC) 150 MG tablet Take 150 mg by mouth as needed for heartburn.    . Vitamin D, Ergocalciferol, (DRISDOL) 50000 units CAPS capsule Take 1 capsule (50,000 Units total) by mouth every 7 (seven) days. 30 capsule 3   No current facility-administered medications for this visit.    Family History  Problem Relation Age of Onset  . Colon cancer Mother 42  . Pancreatic cancer Brother 11  . Leukemia Maternal Grandfather   . Breast cancer Cousin     ROS:  Pertinent items are noted in HPI.  Otherwise, a comprehensive ROS was negative.  Exam:   BP 124/72 mmHg  Pulse 64  Ht 5\' 3"  (1.6 m)  Wt 200 lb (90.719 kg)  BMI 35.44 kg/m2  LMP 02/10/1997 (Approximate) Height: 5\' 3"  (160 cm) Ht Readings from Last 3 Encounters:  02/25/16 5\' 3"  (1.6 m)  08/18/15 5\' 4"  (1.626 m)  12/25/14 5\' 4"  (1.626 m)  General appearance: alert, cooperative and appears stated age Head: Normocephalic, without obvious abnormality, atraumatic Neck: no adenopathy, supple, symmetrical, trachea midline and thyroid normal to inspection and palpation Lungs: clear to auscultation bilaterally Breasts: normal appearance, no masses or tenderness Heart: regular rate and rhythm Abdomen: soft, non-tender; no masses,  no organomegaly Extremities: extremities normal, atraumatic, no cyanosis or edema Skin: Skin color, texture, turgor normal. No rashes or lesions Lymph nodes: Cervical, supraclavicular, and axillary nodes normal. No abnormal inguinal nodes palpated Neurologic: Grossly normal   Pelvic: External genitalia:  no lesions except for multiple inclusion cyst some of which were expressed.              Urethra:  normal appearing  urethra with no masses, tenderness or lesions              Bartholin's and Skene's: normal                 Vagina: normal appearing vagina with normal color and discharge, no lesions              Cervix: anteverted              Pap taken: Yes.   Bimanual Exam:  Uterus:  normal size, contour, position, consistency, mobility, non-tender              Adnexa: no mass, fullness, tenderness               Rectovaginal: Confirms               Anus:  normal sphincter tone, no lesions  Chaperone present: yes  A:  Well Woman with normal exam  Postmenopausal - off HRT 08/2001 HTN [place]: colon and pancreatic cancer Atrophic vaginitis - on Estring  History of Vit D deficiency  Update immunization    P:   Reviewed health and wellness pertinent to exam  Pap smear as above  Mammogram is due 12/17, she has another order placed to get BMD and will get done this summer.  Follow on labs  Update TDaP today  Refill on Estring for a year  Counseled about risk of DVT, CVA, cancer  Refill on Vit d and will follow with labs  Counseled on breast self exam, mammography screening, adequate intake of calcium and vitamin D, diet and exercise, Kegel's exercises return annually or prn  An After Visit Summary was printed and given to the patient.

## 2016-02-26 LAB — VITAMIN D 25 HYDROXY (VIT D DEFICIENCY, FRACTURES): Vit D, 25-Hydroxy: 53 ng/mL (ref 30–100)

## 2016-02-26 LAB — HEPATITIS C ANTIBODY: HCV Ab: NEGATIVE

## 2016-02-28 LAB — HIV ANTIBODY (ROUTINE TESTING W REFLEX): HIV 1&2 Ab, 4th Generation: NONREACTIVE

## 2016-02-28 NOTE — Addendum Note (Signed)
Addended by: Graylon Good on: 02/28/2016 03:18 PM   Modules accepted: Orders, SmartSet

## 2016-03-01 LAB — IPS PAP TEST WITH HPV

## 2016-03-06 ENCOUNTER — Telehealth: Payer: Self-pay

## 2016-03-06 NOTE — Telephone Encounter (Signed)
Left message to call [place] at [phone].  Need to advise patient PA for Estring has been completed and approved from 02/28/2016-11/05/2038.

## 2016-03-07 NOTE — Telephone Encounter (Signed)
Spoke with patient. Notified of approval as seen below. Patient is agreeable and states she was able to pick up her Estring prescription last week.  Routing to provider for final review. Patient agreeable to disposition. Will close encounter.

## 2016-05-19 DIAGNOSIS — M25562 Pain in left knee: Secondary | ICD-10-CM | POA: Insufficient documentation

## 2016-05-19 DIAGNOSIS — S83207A Unspecified tear of unspecified meniscus, current injury, left knee, initial encounter: Secondary | ICD-10-CM | POA: Insufficient documentation

## 2016-05-26 DIAGNOSIS — F411 Generalized anxiety disorder: Secondary | ICD-10-CM | POA: Insufficient documentation

## 2016-05-26 DIAGNOSIS — E782 Mixed hyperlipidemia: Secondary | ICD-10-CM | POA: Insufficient documentation

## 2016-05-26 DIAGNOSIS — K219 Gastro-esophageal reflux disease without esophagitis: Secondary | ICD-10-CM | POA: Insufficient documentation

## 2016-06-08 DIAGNOSIS — G4700 Insomnia, unspecified: Secondary | ICD-10-CM | POA: Insufficient documentation

## 2016-06-08 DIAGNOSIS — G43109 Migraine with aura, not intractable, without status migrainosus: Secondary | ICD-10-CM | POA: Insufficient documentation

## 2016-06-08 DIAGNOSIS — G5603 Carpal tunnel syndrome, bilateral upper limbs: Secondary | ICD-10-CM | POA: Insufficient documentation

## 2016-06-08 DIAGNOSIS — R202 Paresthesia of skin: Secondary | ICD-10-CM

## 2016-06-08 DIAGNOSIS — R200 Anesthesia of skin: Secondary | ICD-10-CM | POA: Insufficient documentation

## 2016-06-16 ENCOUNTER — Ambulatory Visit
Admission: RE | Admit: 2016-06-16 | Discharge: 2016-06-16 | Disposition: A | Discharge status: Home / Self Care | Payer: BLUE CROSS/BLUE SHIELD | Source: Ambulatory Visit | Attending: Nurse Practitioner | Admitting: Nurse Practitioner

## 2016-06-16 ENCOUNTER — Other Ambulatory Visit: Payer: Self-pay | Admitting: Nurse Practitioner

## 2016-06-16 DIAGNOSIS — Z1231 Encounter for screening mammogram for malignant neoplasm of breast: Secondary | ICD-10-CM

## 2016-06-16 DIAGNOSIS — E2839 Other primary ovarian failure: Secondary | ICD-10-CM

## 2016-06-16 DIAGNOSIS — M8580 Other specified disorders of bone density and structure, unspecified site: Secondary | ICD-10-CM

## 2016-06-20 ENCOUNTER — Telehealth: Payer: Self-pay | Admitting: *Deleted

## 2016-06-20 NOTE — Telephone Encounter (Signed)
I have attempted to contact this patient by phone with the following results: left message to return call to [place] at [phone] answering machine (home per [facility]).  Advised call was regarding recent bone density test.  [phone] (Home) *Preferred*

## 2016-06-20 NOTE — Telephone Encounter (Signed)
-----   Message from Kem Boroughs, [place] sent at 06/18/2016  1:58 PM EDT ----- Please let pt know that BMD done on 06/16/2016 shows a T Score at spine at 0.0; right hip neck at -2.5; left hip neck at -1.6. Comparison with prior study 07/12/2006 shows some degenerative changes at the spine and a decrease of BMD at the left hip.  Current measurements put her at Osteopenia in the left hip and borderline Osteoporosis at the right hip.  She needs to start walking at least 3 times a week and continue with upper body weights.  maintain good calcium and Vit D support.  Needs a repeat BMD in 2 yrs.

## 2016-06-22 NOTE — Telephone Encounter (Signed)
Patient returning call.

## 2016-06-22 NOTE — Telephone Encounter (Signed)
Left message for patient to return call. -sco

## 2016-06-26 NOTE — Progress Notes (Signed)
Spoke with patient about results. Patient verbalized agreement of results and will follow recommendations. -sco

## 2016-06-26 NOTE — Telephone Encounter (Signed)
Patient returned call during lunch and left a message to call back. °

## 2016-07-20 NOTE — Telephone Encounter (Signed)
I have attempted to contact this patient by phone with the following results: left message to return call to [place] at [phone] answering machine (home per [facility]).  Advised call was regarding recent bone density test and to ask for Lovena Le if I am unavailable.  [phone] (Home) *Preferred*

## 2016-07-20 NOTE — Telephone Encounter (Signed)
Patient was notified on 06/26/16 in result note.  Advised to ignore previous phone call as I did not realize that patient had been previously notified.  Apologized to patient for duplicate phone calls. Closing encounter.

## 2016-11-09 ENCOUNTER — Ambulatory Visit
Admission: RE | Admit: 2016-11-09 | Discharge: 2016-11-09 | Disposition: A | Discharge status: Home / Self Care | Payer: PPO | Source: Ambulatory Visit | Attending: Nurse Practitioner | Admitting: Nurse Practitioner

## 2016-11-09 DIAGNOSIS — Z1231 Encounter for screening mammogram for malignant neoplasm of breast: Secondary | ICD-10-CM | POA: Diagnosis not present

## 2016-12-22 DIAGNOSIS — H2513 Age-related nuclear cataract, bilateral: Secondary | ICD-10-CM | POA: Diagnosis not present

## 2016-12-22 DIAGNOSIS — H5213 Myopia, bilateral: Secondary | ICD-10-CM | POA: Diagnosis not present

## 2017-02-09 DIAGNOSIS — F411 Generalized anxiety disorder: Secondary | ICD-10-CM | POA: Diagnosis not present

## 2017-02-09 DIAGNOSIS — K219 Gastro-esophageal reflux disease without esophagitis: Secondary | ICD-10-CM | POA: Diagnosis not present

## 2017-02-09 DIAGNOSIS — E782 Mixed hyperlipidemia: Secondary | ICD-10-CM | POA: Diagnosis not present

## 2017-02-09 DIAGNOSIS — I10 Essential (primary) hypertension: Secondary | ICD-10-CM | POA: Diagnosis not present

## 2017-02-26 ENCOUNTER — Other Ambulatory Visit: Payer: Self-pay | Admitting: Nurse Practitioner

## 2017-02-26 NOTE — Telephone Encounter (Signed)
Medication refill request: Estradiol Last AEX:  02/25/16 PG Next AEX: 03/02/17 PG Last MMG (if hormonal medication request): 11/09/16 BIRADS1, Density B, TBC Refill authorized: 02/25/16 #1 3R. Please advise. Thank you.

## 2017-03-02 ENCOUNTER — Encounter: Payer: Self-pay | Admitting: Nurse Practitioner

## 2017-03-02 ENCOUNTER — Ambulatory Visit (INDEPENDENT_AMBULATORY_CARE_PROVIDER_SITE_OTHER): Payer: PPO | Admitting: Nurse Practitioner

## 2017-03-02 VITALS — BP 124/60 | HR 92 | Resp 16 | Ht 63.5 in | Wt 199.0 lb

## 2017-03-02 DIAGNOSIS — M8580 Other specified disorders of bone density and structure, unspecified site: Secondary | ICD-10-CM | POA: Diagnosis not present

## 2017-03-02 DIAGNOSIS — E559 Vitamin D deficiency, unspecified: Secondary | ICD-10-CM | POA: Diagnosis not present

## 2017-03-02 DIAGNOSIS — R8290 Unspecified abnormal findings in urine: Secondary | ICD-10-CM | POA: Diagnosis not present

## 2017-03-02 DIAGNOSIS — D225 Melanocytic nevi of trunk: Secondary | ICD-10-CM | POA: Diagnosis not present

## 2017-03-02 DIAGNOSIS — Z01411 Encounter for gynecological examination (general) (routine) with abnormal findings: Secondary | ICD-10-CM | POA: Diagnosis not present

## 2017-03-02 DIAGNOSIS — L821 Other seborrheic keratosis: Secondary | ICD-10-CM | POA: Diagnosis not present

## 2017-03-02 DIAGNOSIS — Z0000 Encounter for general adult medical examination without abnormal findings: Secondary | ICD-10-CM | POA: Diagnosis not present

## 2017-03-02 DIAGNOSIS — L309 Dermatitis, unspecified: Secondary | ICD-10-CM | POA: Diagnosis not present

## 2017-03-02 DIAGNOSIS — D2372 Other benign neoplasm of skin of left lower limb, including hip: Secondary | ICD-10-CM | POA: Diagnosis not present

## 2017-03-02 LAB — POCT URINALYSIS DIPSTICK
Bilirubin, UA: NEGATIVE
Blood, UA: NEGATIVE
GLUCOSE UA: NEGATIVE
KETONES UA: NEGATIVE
Nitrite, UA: NEGATIVE
Protein, UA: NEGATIVE
Urobilinogen, UA: 0.2 E.U./dL
pH, UA: 7 (ref 5.0–8.0)

## 2017-03-02 MED ORDER — ESTRADIOL 2 MG VA RING: VAGINAL_RING | VAGINAL | 4 refills | Status: DC

## 2017-03-02 MED ORDER — VITAMIN D (ERGOCALCIFEROL) 1.25 MG (50000 UNIT) PO CAPS: 50000.0000 [IU] | ORAL_CAPSULE | ORAL | 3 refills | Status: DC

## 2017-03-02 NOTE — Progress Notes (Signed)
Patient ID: Brittany Ward, female   DOB: February 26, 1951, 66 y.o.   MRN: [id number]  66 y.o. G74P2002 Divorced  Caucasian Fe here for annual exam.  Still using Estring with good results from vaginal dryness.  Left knee 'went out' last July and went through 10 weeks of PT.  No pain now.  OA was found on X Ray bilaterally.  Still having some pain on the right.  This restricts of a lot walking.   Patient's last menstrual period was 02/10/1997 (approximate).          Sexually active: No.  The current method of family planning is tubal ligation.    Exercising: No.  The patient does not participate in regular exercise at present. Smoker:  Former [place] Maintenance: Pap: 02/25/16, Negative with neg HR HPV  11/24/11, Negative with neg HR HPV History of Abnormal Pap: no MMG: 11/09/16, Bi-Rads 1:  Negative Self Breast exams: yes Colonoscopy: 05/29/14,  hyperplastic polyp, repeat in 5 years BMD: 06/16/16 T Score: 0.0 Spine / -2.5 Right Femur Neck / -1.6 Left Femur Neck TDaP: 02/25/16 Shingles: Never, did not have chicken pox Pneumonia: never Hep C: 02/25/16 Labs: PCP takes care of all labs.  UTI asymptomatic 05/27/16.  Did not get urine checked in April will do today due to odor of urine.   reports that she has quit smoking. Her smoking use included Cigarettes. She smoked 0.50 packs per day. She has never used smokeless tobacco. She reports that she drinks about 0.6 - 1.2 oz of alcohol per week . She reports that she does not use drugs.  Past Medical History:  Diagnosis Date  . Hyperlipidemia   . Hypertension   . Tubular adenoma of colon 08/2002, 12 2006  . Vitamin D deficiency     Past Surgical History:  Procedure Laterality Date  . CESAREAN SECTION     x2  . TUBAL LIGATION Bilateral     Current Outpatient Prescriptions  Medication Sig Dispense Refill  . acyclovir (ZOVIRAX) 200 MG capsule TK 1 C PO FIVE TIMES DAILY  PRN FEVER BLISTERS    . acyclovir ointment (ZOVIRAX) 5 % APPLY AA SIX  TIMES PER DAY    . aspirin EC 81 MG tablet Take 81 mg by mouth daily.    . Calcium Carbonate-Vit D-Min (CALCIUM 1200 PO) Take 1 tablet by mouth daily.    . Cyanocobalamin (VITAMIN B 12 PO) Take by mouth daily. Vitamin B12    . desipramine (NORPRAMIN) 50 MG tablet Take 50 mg by mouth daily.    Marland Kitchen ESTRING 2 MG vaginal ring INSERT 1 RING INTRAVAGINALLY EVERY 3 MONTHS. FOLLOW PACKAGE DIRECTIONS 1 each 0  . ezetimibe (ZETIA) 10 MG tablet Take 10 mg by mouth daily.    . hydrochlorothiazide (HYDRODIURIL) 25 MG tablet Take 25 mg by mouth daily.    Marland Kitchen losartan (COZAAR) 50 MG tablet Take 50 mg by mouth daily.    . Multiple Vitamin (MULTIVITAMIN) tablet Take 1 tablet by mouth daily.    . ranitidine (ZANTAC) 150 MG tablet Take 150 mg by mouth as needed for heartburn.    . Vitamin D, Ergocalciferol, (DRISDOL) 50000 units CAPS capsule Take 1 capsule (50,000 Units total) by mouth every 7 (seven) days. 30 capsule 3   No current facility-administered medications for this visit.     Family History  Problem Relation Age of Onset  . Colon cancer Mother 65  . Pancreatic cancer Brother 31  . Leukemia Maternal Grandfather   .  Breast cancer Cousin     ROS:  Pertinent items are noted in HPI.  Otherwise, a comprehensive ROS was negative.  Exam:   BP 124/60 (BP Location: Right Arm, Patient Position: Sitting, Cuff Size: Large)   Pulse 92   Resp 16   Ht 5' 3.5" (1.613 m)   Wt 199 lb (90.3 kg)   LMP 02/10/1997 (Approximate)   BMI 34.70 kg/m  Height: 5' 3.5" (161.3 cm) Ht Readings from Last 3 Encounters:  03/02/17 5' 3.5" (1.613 m)  02/25/16 5\' 3"  (1.6 m)  08/18/15 5\' 4"  (1.626 m)    General appearance: alert, cooperative and appears stated age Head: Normocephalic, without obvious abnormality, atraumatic Neck: no adenopathy, supple, symmetrical, trachea midline and thyroid normal to inspection and palpation Lungs: clear to auscultation bilaterally Breasts: normal appearance, no masses or  tenderness Heart: regular rate and rhythm Abdomen: soft, non-tender; no masses,  no organomegaly Extremities: extremities normal, atraumatic, no cyanosis or edema Skin: Skin color, texture, turgor normal. No rashes or lesions Lymph nodes: Cervical, supraclavicular, and axillary nodes normal. No abnormal inguinal nodes palpated Neurologic: Grossly normal   Pelvic: External genitalia:  no lesions              Urethra:  normal appearing urethra with no masses, tenderness or lesions              Bartholin's and Skene's: normal                 Vagina: normal appearing vagina with normal color and discharge, no lesions              Cervix: anteverted              Pap taken: No. Bimanual Exam:  Uterus:  normal size, contour, position, consistency, mobility, non-tender              Adnexa: no mass, fullness, tenderness               Rectovaginal: Confirms               Anus:  normal sphincter tone, no lesions  Chaperone present: yes  A:  Well Woman with normal exam  Postmenopausal - off HRT 08/2001 HTN FMH: colon and pancreatic cancer Atrophic vaginitis - on Estring             History of Vit D deficiency              P:   Reviewed health and wellness pertinent to exam  Pap smear: no  Mammogram is due 11/2017  Refill on Vit D and will follow with labs  Refill on Estring for a year  Counseled with risk of CVA, DVT, cancer, etc.  Counseled on breast self exam, mammography screening, use and side effects of HRT, adequate intake of calcium and vitamin D, diet and exercise, Kegel's exercises return annually or prn  An After Visit Summary was printed and given to the patient.

## 2017-03-02 NOTE — Patient Instructions (Signed)

## 2017-03-03 LAB — VITAMIN D 25 HYDROXY (VIT D DEFICIENCY, FRACTURES): VIT D 25 HYDROXY: 45 ng/mL (ref 30–100)

## 2017-03-03 NOTE — Progress Notes (Signed)
Encounter reviewed by Dr. Duke Weisensel Amundson C. Silva.  

## 2017-03-04 LAB — URINE CULTURE

## 2017-03-05 ENCOUNTER — Telehealth: Payer: Self-pay

## 2017-03-05 NOTE — Telephone Encounter (Signed)
Left message to call Kaitlyn at [phone]. 

## 2017-03-05 NOTE — Telephone Encounter (Signed)
-----   Message from Kem Boroughs, [place] sent at 03/05/2017  7:50 AM EDT ----- Please let pt know that urine culture is negative other than vaginal contamination. The Vit D is 45 and she is to continue same dose as she has been taking.

## 2017-03-06 NOTE — Telephone Encounter (Signed)
Patient returned call and will call back on her next break.

## 2017-03-07 NOTE — Telephone Encounter (Signed)
Patient returning your call.

## 2017-03-07 NOTE — Telephone Encounter (Signed)
Spoke with patient, advised of results and recommendations as seen below per Kem Boroughs, NP. Patient verbalizes understanding and is agreeable.  Routing to provider for final review. Patient is agreeable to disposition. Will close encounter.

## 2017-03-07 NOTE — Telephone Encounter (Signed)
Left message to call Rhine at [phone].  Please allow patient to speak with Colletta [place] or Sharee Pimple if I am not available.

## 2017-06-01 DIAGNOSIS — R200 Anesthesia of skin: Secondary | ICD-10-CM | POA: Diagnosis not present

## 2017-06-01 DIAGNOSIS — G43009 Migraine without aura, not intractable, without status migrainosus: Secondary | ICD-10-CM | POA: Diagnosis not present

## 2017-06-01 DIAGNOSIS — G5603 Carpal tunnel syndrome, bilateral upper limbs: Secondary | ICD-10-CM | POA: Diagnosis not present

## 2017-06-01 DIAGNOSIS — R202 Paresthesia of skin: Secondary | ICD-10-CM | POA: Diagnosis not present

## 2017-06-04 ENCOUNTER — Other Ambulatory Visit: Payer: Self-pay | Admitting: Nurse Practitioner

## 2017-06-04 NOTE — Telephone Encounter (Signed)
Medication refill request: Estring  Last AEX:  03-02-17 Next AEX: 03-15-18 Last MMG (if hormonal medication request): 11-09-16 WNL  Refill authorized: please advise

## 2017-06-06 ENCOUNTER — Telehealth: Payer: Self-pay | Admitting: Obstetrics and Gynecology

## 2017-06-06 NOTE — Telephone Encounter (Signed)
Left message on voicemail to call and reschedule cancelled appointment. Mail letter °

## 2017-06-22 DIAGNOSIS — F411 Generalized anxiety disorder: Secondary | ICD-10-CM | POA: Diagnosis not present

## 2017-06-22 DIAGNOSIS — E782 Mixed hyperlipidemia: Secondary | ICD-10-CM | POA: Diagnosis not present

## 2017-06-22 DIAGNOSIS — I10 Essential (primary) hypertension: Secondary | ICD-10-CM | POA: Diagnosis not present

## 2017-06-22 DIAGNOSIS — K219 Gastro-esophageal reflux disease without esophagitis: Secondary | ICD-10-CM | POA: Diagnosis not present

## 2017-06-23 DIAGNOSIS — I10 Essential (primary) hypertension: Secondary | ICD-10-CM | POA: Diagnosis not present

## 2017-06-23 DIAGNOSIS — E782 Mixed hyperlipidemia: Secondary | ICD-10-CM | POA: Diagnosis not present

## 2017-10-12 DIAGNOSIS — Z0000 Encounter for general adult medical examination without abnormal findings: Secondary | ICD-10-CM | POA: Diagnosis not present

## 2017-10-12 DIAGNOSIS — Z23 Encounter for immunization: Secondary | ICD-10-CM | POA: Diagnosis not present

## 2017-10-12 DIAGNOSIS — I10 Essential (primary) hypertension: Secondary | ICD-10-CM | POA: Diagnosis not present

## 2017-10-12 DIAGNOSIS — K219 Gastro-esophageal reflux disease without esophagitis: Secondary | ICD-10-CM | POA: Diagnosis not present

## 2017-10-12 DIAGNOSIS — S83207A Unspecified tear of unspecified meniscus, current injury, left knee, initial encounter: Secondary | ICD-10-CM | POA: Diagnosis not present

## 2017-10-12 DIAGNOSIS — M85859 Other specified disorders of bone density and structure, unspecified thigh: Secondary | ICD-10-CM | POA: Insufficient documentation

## 2017-11-08 ENCOUNTER — Other Ambulatory Visit: Payer: Self-pay | Admitting: Internal Medicine

## 2017-11-08 DIAGNOSIS — Z1231 Encounter for screening mammogram for malignant neoplasm of breast: Secondary | ICD-10-CM

## 2017-11-29 ENCOUNTER — Ambulatory Visit
Admission: RE | Admit: 2017-11-29 | Discharge: 2017-11-29 | Disposition: A | Discharge status: Home / Self Care | Payer: PPO | Source: Ambulatory Visit | Attending: Internal Medicine | Admitting: Internal Medicine

## 2017-11-29 DIAGNOSIS — Z1231 Encounter for screening mammogram for malignant neoplasm of breast: Secondary | ICD-10-CM | POA: Diagnosis not present

## 2017-12-28 DIAGNOSIS — D3132 Benign neoplasm of left choroid: Secondary | ICD-10-CM | POA: Diagnosis not present

## 2017-12-28 DIAGNOSIS — H5213 Myopia, bilateral: Secondary | ICD-10-CM | POA: Diagnosis not present

## 2017-12-28 DIAGNOSIS — H2513 Age-related nuclear cataract, bilateral: Secondary | ICD-10-CM | POA: Diagnosis not present

## 2017-12-28 DIAGNOSIS — H40013 Open angle with borderline findings, low risk, bilateral: Secondary | ICD-10-CM | POA: Diagnosis not present

## 2018-01-25 DIAGNOSIS — L603 Nail dystrophy: Secondary | ICD-10-CM | POA: Diagnosis not present

## 2018-01-25 DIAGNOSIS — L600 Ingrowing nail: Secondary | ICD-10-CM | POA: Diagnosis not present

## 2018-02-01 DIAGNOSIS — L603 Nail dystrophy: Secondary | ICD-10-CM | POA: Diagnosis not present

## 2018-02-01 DIAGNOSIS — L600 Ingrowing nail: Secondary | ICD-10-CM | POA: Diagnosis not present

## 2018-03-14 DIAGNOSIS — S91109S Unspecified open wound of unspecified toe(s) without damage to nail, sequela: Secondary | ICD-10-CM | POA: Diagnosis not present

## 2018-03-14 DIAGNOSIS — L600 Ingrowing nail: Secondary | ICD-10-CM | POA: Diagnosis not present

## 2018-03-15 ENCOUNTER — Ambulatory Visit: Payer: PPO | Admitting: Nurse Practitioner

## 2018-03-15 DIAGNOSIS — D2372 Other benign neoplasm of skin of left lower limb, including hip: Secondary | ICD-10-CM | POA: Diagnosis not present

## 2018-03-15 DIAGNOSIS — L918 Other hypertrophic disorders of the skin: Secondary | ICD-10-CM | POA: Diagnosis not present

## 2018-03-15 DIAGNOSIS — D225 Melanocytic nevi of trunk: Secondary | ICD-10-CM | POA: Diagnosis not present

## 2018-03-15 DIAGNOSIS — L821 Other seborrheic keratosis: Secondary | ICD-10-CM | POA: Diagnosis not present

## 2018-04-12 DIAGNOSIS — Z1151 Encounter for screening for human papillomavirus (HPV): Secondary | ICD-10-CM | POA: Diagnosis not present

## 2018-04-12 DIAGNOSIS — Z113 Encounter for screening for infections with a predominantly sexual mode of transmission: Secondary | ICD-10-CM | POA: Diagnosis not present

## 2018-04-12 DIAGNOSIS — Z124 Encounter for screening for malignant neoplasm of cervix: Secondary | ICD-10-CM | POA: Diagnosis not present

## 2018-04-12 DIAGNOSIS — Z0000 Encounter for general adult medical examination without abnormal findings: Secondary | ICD-10-CM | POA: Diagnosis not present

## 2018-04-12 DIAGNOSIS — R87612 Low grade squamous intraepithelial lesion on cytologic smear of cervix (LGSIL): Secondary | ICD-10-CM | POA: Diagnosis not present

## 2018-04-13 DIAGNOSIS — Z0000 Encounter for general adult medical examination without abnormal findings: Secondary | ICD-10-CM | POA: Diagnosis not present

## 2018-04-13 DIAGNOSIS — E782 Mixed hyperlipidemia: Secondary | ICD-10-CM | POA: Diagnosis not present

## 2018-04-13 DIAGNOSIS — I10 Essential (primary) hypertension: Secondary | ICD-10-CM | POA: Diagnosis not present

## 2018-04-17 DIAGNOSIS — R8290 Unspecified abnormal findings in urine: Secondary | ICD-10-CM | POA: Diagnosis not present

## 2018-05-29 ENCOUNTER — Telehealth: Payer: Self-pay | Admitting: Certified Nurse Midwife

## 2018-05-29 NOTE — Telephone Encounter (Signed)
Called and left a message for this established patient to call back to schedule a referral appointment with our office. Need to confirm the reason for the referral. I called the referring office and left a message for the referral coordinator to return my call.

## 2018-05-29 NOTE — Telephone Encounter (Signed)
Heather from [facility] returned my call. She said the patient is being referred for follow up on an abnormal pap smear.

## 2018-05-31 DIAGNOSIS — G43009 Migraine without aura, not intractable, without status migrainosus: Secondary | ICD-10-CM | POA: Diagnosis not present

## 2018-05-31 DIAGNOSIS — R202 Paresthesia of skin: Secondary | ICD-10-CM | POA: Diagnosis not present

## 2018-05-31 DIAGNOSIS — R200 Anesthesia of skin: Secondary | ICD-10-CM | POA: Diagnosis not present

## 2018-05-31 DIAGNOSIS — G5603 Carpal tunnel syndrome, bilateral upper limbs: Secondary | ICD-10-CM | POA: Diagnosis not present

## 2018-06-13 NOTE — Progress Notes (Signed)
GYNECOLOGY  VISIT   HPI: 67 y.o.   Divorced  Caucasian  female   G2P2002 with Patient's last menstrual period was 02/10/1997 (approximate).   here for consult for abnormal pap low grade squamous intraepithelial lesion from 04/15/18.  Negative HR HPV - neg 16/18.  Patient is also having vaginal discharge with odor and had been tested positive for bacterial vaginosis, was treated but still concerned.  First time she had discharge, odor.  Dx was BV. Took Flagyl bid x 7 days, finished over a month ago.  Still has some discharge but no odor.   Using Estring for a long time.  Did take it out recently.   Early menopause age 63.   Not sexually active for 10 years.  GYNECOLOGIC HISTORY: Patient's last menstrual period was 02/10/1997 (approximate). Contraception:  Tubal ligation Menopausal hormone therapy:  Estring 2mg  Last mammogram:  11/30/2017 BIRADS 1; Negative Last pap smear:   02/25/2016 negative        OB History    Gravida  2   Para  2   Term  2   Preterm  0   AB  0   Living  2     SAB  0   TAB  0   Ectopic  0   Multiple  0   Live Births  2              Patient Active Problem List   Diagnosis Date Noted  . Osteopenia of hip 10/12/2017  . Numbness and tingling in both hands 06/08/2016  . Insomnia 06/08/2016  . Classic migraine with aura 06/08/2016  . Bilateral carpal tunnel syndrome 06/08/2016  . Mixed hyperlipidemia 05/26/2016  . Gastroesophageal reflux disease without esophagitis 05/26/2016  . GAD (generalized anxiety disorder) 05/26/2016  . Left knee pain 05/19/2016  . Acute meniscal tear of left knee 05/19/2016  . FH: pancreatic cancer 12/05/2013  . FH: colon cancer 12/05/2013  . Situational depression 12/05/2013  . Postmenopausal atrophic vaginitis 12/05/2013  . HTN (hypertension) 12/05/2013  . Unspecified vitamin D deficiency 12/05/2013    Past Medical History:  Diagnosis Date  . Hyperlipidemia   . Hypertension   . Tubular adenoma of  colon 08/2002, 12 2006  . Vitamin D deficiency     Past Surgical History:  Procedure Laterality Date  . CESAREAN SECTION     x2  . TUBAL LIGATION Bilateral     Current Outpatient Medications  Medication Sig Dispense Refill  . acyclovir (ZOVIRAX) 200 MG capsule TK 1 C PO FIVE TIMES DAILY  PRN FEVER BLISTERS    . acyclovir ointment (ZOVIRAX) 5 % APPLY AA SIX TIMES PER DAY    . aspirin EC 81 MG tablet Take 81 mg by mouth daily.    . Calcium Carbonate-Vit D-Min (CALCIUM 1200 PO) Take 1 tablet by mouth daily.    . Cyanocobalamin (VITAMIN B 12 PO) Take by mouth daily. Vitamin B12    . desipramine (NORPRAMIN) 50 MG tablet Take 50 mg by mouth daily.    Marland Kitchen ESTRING 2 MG vaginal ring INSERT 1 RING INTRAVAGINALLY EVERY 3 MONTHS. FOLLOW PACKAGE DIRECTIONS 1 each 2  . ezetimibe (ZETIA) 10 MG tablet Take 10 mg by mouth daily.    . hydrochlorothiazide (HYDRODIURIL) 25 MG tablet Take 25 mg by mouth daily.    Marland Kitchen losartan (COZAAR) 50 MG tablet Take 50 mg by mouth daily.    . Multiple Vitamin (MULTIVITAMIN) tablet Take 1 tablet by mouth daily.    Marland Kitchen  ranitidine (ZANTAC) 150 MG tablet Take 150 mg by mouth as needed for heartburn.    . Vitamin D, Ergocalciferol, (DRISDOL) 50000 units CAPS capsule Take 1 capsule (50,000 Units total) by mouth every 7 (seven) days. 30 capsule 3   No current facility-administered medications for this visit.      ALLERGIES: Atorvastatin  Family History  Problem Relation Age of Onset  . Heart disease Father   . Colon cancer Mother 51  . Pancreatic cancer Brother 31  . Leukemia Maternal Grandfather   . Breast cancer Cousin   . Brain cancer Cousin     Social History   Socioeconomic History  . Marital status: Divorced    Spouse name: Not on file  . Number of children: Not on file  . Years of education: Not on file  . Highest education level: Not on file  Occupational History  . Not on file  Social Needs  . Financial resource strain: Not on file  . Food insecurity:     Worry: Not on file    Inability: Not on file  . Transportation needs:    Medical: Not on file    Non-medical: Not on file  Tobacco Use  . Smoking status: Former Smoker    Packs/day: 0.50    Types: Cigarettes  . Smokeless tobacco: Never Used  Substance and Sexual Activity  . Alcohol use: Yes    Alcohol/week: 1.0 - 2.0 standard drinks    Types: 1 - 2 Standard drinks or equivalent per week  . Drug use: No  . Sexual activity: Never    Partners: Male    Birth control/protection: Surgical    Comment: BTL  Lifestyle  . Physical activity:    Days per week: Not on file    Minutes per session: Not on file  . Stress: Not on file  Relationships  . Social connections:    Talks on phone: Not on file    Gets together: Not on file    Attends religious service: Not on file    Active member of club or organization: Not on file    Attends meetings of clubs or organizations: Not on file    Relationship status: Not on file  . Intimate partner violence:    Fear of current or ex partner: Not on file    Emotionally abused: Not on file    Physically abused: Not on file    Forced sexual activity: Not on file  Other Topics Concern  . Not on file  Social History Narrative  . Not on file    Review of Systems  Constitutional: Negative.   HENT: Negative.   Eyes: Negative.   Respiratory: Negative.   Cardiovascular: Negative.   Gastrointestinal: Negative.   Endocrine: Negative.   Genitourinary: Positive for urgency and vaginal discharge.       Nocturia  Musculoskeletal: Negative.   Skin: Negative.   Allergic/Immunologic: Negative.   Neurological: Negative.   Hematological: Negative.   Psychiatric/Behavioral: Negative.   All other systems reviewed and are negative.   PHYSICAL EXAMINATION:    BP (!) 168/78   Pulse 88   Ht 5\' 3"  (1.6 m)   Wt 212 lb (96.2 kg)   LMP 02/10/1997 (Approximate)   BMI 37.55 kg/m     General appearance: alert, cooperative and appears stated age Head:  Normocephalic, without obvious abnormality, atraumatic Neck: no adenopathy, supple, symmetrical, trachea midline and thyroid normal to inspection and palpation Lungs: clear to auscultation bilaterally Heart: regular  rate and rhythm Abdomen: soft, non-tender, no masses,  no organomegaly Extremities: extremities normal, atraumatic, no cyanosis or edema Skin: Skin color, texture, turgor normal. No rashes or lesions No abnormal inguinal nodes palpated Neurologic: Grossly normal  Pelvic: External genitalia:  no lesions              Urethra:  normal appearing urethra with no masses, tenderness or lesions              Bartholins and Skenes: normal                 Vagina: normal appearing vagina with normal color and discharge, no lesions              Cervix: no lesions                Bimanual Exam:  Uterus:  normal size, contour, position, consistency, mobility, non-tender              Adnexa: no mass, fullness, tenderness              Rectal exam: Yes.  .  Confirms.              Anus:  normal sphincter tone, no lesions  Chaperone was present for exam.  ASSESSMENT  LGSIL pap and negative HR HPV.  Recent bacterial vaginosis.  Early menopause.  Estring user.   PLAN  We had a comprehensive discussion of abnormal pap, HPV, colposcopy, and LEEP procedure.  I recommend pap and HR HPV in one year with annual exam.  Ok to place Estring in vagina.  Discussion of vaginitis.  Affirm taken.   An After Visit Summary was printed and given to the patient.  _30_____ minutes face to face time of which over 50% was spent in counseling.

## 2018-06-14 ENCOUNTER — Other Ambulatory Visit: Payer: Self-pay

## 2018-06-14 ENCOUNTER — Ambulatory Visit (INDEPENDENT_AMBULATORY_CARE_PROVIDER_SITE_OTHER): Payer: PPO | Admitting: Obstetrics and Gynecology

## 2018-06-14 ENCOUNTER — Encounter: Payer: Self-pay | Admitting: Obstetrics and Gynecology

## 2018-06-14 VITALS — BP 168/78 | HR 88 | Ht 63.0 in | Wt 212.0 lb

## 2018-06-14 DIAGNOSIS — N760 Acute vaginitis: Secondary | ICD-10-CM | POA: Diagnosis not present

## 2018-06-14 DIAGNOSIS — R87612 Low grade squamous intraepithelial lesion on cytologic smear of cervix (LGSIL): Secondary | ICD-10-CM | POA: Diagnosis not present

## 2018-06-14 NOTE — Patient Instructions (Signed)
Cervical Dysplasia Cervical dysplasia is a condition in which a woman's cervix cells have abnormal changes. The cervix is the opening of the uterus (womb). It is located between the vagina and the uterus. Cervical dysplasia may be an early sign of cervical cancer. If left untreated, this condition may become more severe and may progress to cervical cancer. Early detection, treatment, and follow-up care are very important. What are the causes? Cervical dysplasia can be caused by a human papillomavirus (HPV) infection. HPV is the most common sexually transmitted infection (STI). HPV is spread from person to person through sexual contact. This includes oral, vaginal, or anal sex. What increases the risk? The following factors may make you more likely to develop this condition:  Having had a sexually transmitted infection (STI), such as herpes, chlamydia or HPV.  Becoming sexually active before age 65.  Having had more than one sexual partner.  Having a sexual partner who has multiple sexual partners.  Not using protection, such as a condom, during sex, especially with new sexual partners.  Having a history of cancer of the vagina or vulva.  Having a weakened body defense (immune) system.  Being the daughter of a woman who took diethylstilbestrol (DES) during pregnancy.  Having a family history of cervical cancer.  Smoking.  Using oral contraceptives, also called birth control pills.  Having had three or more full-term pregnancies.  What are the signs or symptoms? There are usually no symptoms of this condition. If you do have symptoms, they may include:  Abnormal vaginal discharge.  Bleeding between periods or after sex.  Bleeding during menopause.  Pain during sex (dyspareunia).  How is this diagnosed? A test called a Pap test may be done. During this test, cells are taken from the cervix and then examined under a microscope. A test in which tissue is removed from the cervix  (biopsy) may also be done if the Pap test is abnormal or if the cervix looks abnormal. How is this treated? Treatment varies based on the severity of the condition. Treatment may include:  Cryotherapy. During cryotherapy, the abnormal cells are frozen with a steel-tip instrument.  Loop electrosurgical excision procedure (LEEP). This procedure removes abnormal tissue from the cervix.  Surgery to remove abnormal tissue. This is usually done in more advanced cases. Surgical options include: ? A cone biopsy. This is a procedure in which the cervical canal and a portion of the center of the cervix are removed. ? Hysterectomy. This is a surgery in which the uterus and cervix are removed.  Follow these instructions at home:  Take over-the-counter and prescription medicines only as told by your health care provider.  Do not use tampons, have sex, or douche until your health care provider says it is okay.  Keep follow-up visits as told by your health care provider. This is important. Women who have been treated for cervical dysplasia should have regular pelvic exams and Pap tests as told by their health care provider. How is this prevented?  Practice safe sex to help prevent sexually transmitted infections (STI) that may cause this condition.  Have regular Pap tests. Talk with your health care provider about how often you need these tests. Pap tests will help identify cell changes that can lead to cancer. Contact a health care provider if:  You develop genital warts. Get help right away if:  You have a fever.  You have abnormal vaginal discharge.  Your menstrual period is heavier than normal.  You develop bright  red bleeding. This may include blood clots.  You have pain or cramps that get worse, and medicine does not help to relieve your pain.  You feel light-headed and you are unusually weak.  You have fainting spells.  You have pain in the abdomen. Summary  Cervical dysplasia  is a condition in which a woman's cervix cells have abnormal changes.  If left untreated, this condition may become more severe and may progress to cervical cancer.  Early detection, treatment, and follow-up care are very important in managing this condition.  Have regular pelvic exams and Pap tests. Talk with your health care provider about how often you need these tests. Pap tests will help identify cell changes that can lead to cancer. This information is not intended to replace advice given to you by your health care provider. Make sure you discuss any questions you have with your health care provider. Document Released: 10/23/2005 Document Revised: 10/26/2016 Document Reviewed: 10/26/2016 Elsevier Interactive Patient Education  2017 [employer] Vaginitis Vaginitis is a condition in which the vaginal tissue swells and becomes red (inflamed). This condition is most often caused by a change in the normal balance of bacteria and yeast that live in the vagina. This change causes an overgrowth of certain bacteria or yeast, which causes the inflammation. There are different types of vaginitis, but the most common types are:  Bacterial vaginosis.  Yeast infection (candidiasis).  Trichomoniasis vaginitis. This is a sexually transmitted disease (STD).  Viral vaginitis.  Atrophic vaginitis.  Allergic vaginitis.  What are the causes? The cause of this condition depends on the type of vaginitis. It can be caused by:  Bacteria (bacterial vaginosis).  Yeast, which is a fungus (yeast infection).  A parasite (trichomoniasis vaginitis).  A virus (viral vaginitis).  Low hormone levels (atrophic vaginitis). Low hormone levels can occur during pregnancy, breastfeeding, or after menopause.  Irritants, such as bubble baths, scented tampons, and feminine sprays (allergic vaginitis).  Other factors can change the normal balance of the yeast and bacteria that live in the vagina. These  include:  Antibiotic medicines.  Poor hygiene.  Diaphragms, vaginal sponges, spermicides, birth control pills, and intrauterine devices (IUD).  Sex.  Infection.  Uncontrolled diabetes.  A weakened defense (immune) system.  What increases the risk? This condition is more likely to develop in women who:  Smoke.  Use vaginal douches, scented tampons, or scented sanitary pads.  Wear tight-fitting pants.  Wear thong underwear.  Use oral birth control pills or an IUD.  Have sex without a condom.  Have multiple sex partners.  Have an STD.  Frequently use the spermicide nonoxynol-9.  Eat lots of foods high in sugar.  Have uncontrolled diabetes.  Have low estrogen levels.  Have a weakened immune system from an immune disorder or medical treatment.  Are pregnant or breastfeeding.  What are the signs or symptoms? Symptoms vary depending on the cause of the vaginitis. Common symptoms include:  Abnormal vaginal discharge. ? The discharge is white, gray, or yellow with bacterial vaginosis. ? The discharge is thick, white, and cheesy with a yeast infection. ? The discharge is frothy and yellow or greenish with trichomoniasis.  A bad vaginal smell. The smell is fishy with bacterial vaginosis.  Vaginal itching, pain, or swelling.  Sex that is painful.  Pain or burning when urinating.  Sometimes there are no symptoms. How is this diagnosed? This condition is diagnosed based on your symptoms and medical history. A physical exam, including a pelvic exam,  will also be done. You may also have other tests, including:  Tests to determine the pH level (acidity or alkalinity) of your vagina.  A whiff test, to assess the odor that results when a sample of your vaginal discharge is mixed with a potassium hydroxide solution.  Tests of vaginal fluid. A sample will be examined under a microscope.  How is this treated? Treatment varies depending on the type of vaginitis you  have. Your treatment may include:  Antibiotic creams or pills to treat bacterial vaginosis and trichomoniasis.  Antifungal medicines, such as vaginal creams or suppositories, to treat a yeast infection.  Medicine to ease discomfort if you have viral vaginitis. Your sexual partner should also be treated.  Estrogen delivered in a cream, pill, suppository, or vaginal ring to treat atrophic vaginitis. If vaginal dryness occurs, lubricants and moisturizing creams may help. You may need to avoid scented soaps, sprays, or douches.  Stopping use of a product that is causing allergic vaginitis. Then using a vaginal cream to treat the symptoms.  Follow these instructions at home: Lifestyle  Keep your genital area clean and dry. Avoid soap, and only rinse the area with water.  Do not douche or use tampons until your health care provider says it is okay to do so. Use sanitary pads, if needed.  Do not have sex until your health care provider approves. When you can return to sex, practice safe sex and use condoms.  Wipe from front to back. This avoids the spread of bacteria from the rectum to the vagina. General instructions  Take over-the-counter and prescription medicines only as told by your health care provider.  If you were prescribed an antibiotic medicine, take or use it as told by your health care provider. Do not stop taking or using the antibiotic even if you start to feel better.  Keep all follow-up visits as told by your health care provider. This is important. How is this prevented?  Use mild, non-scented products. Do not use things that can irritate the vagina, such as fabric softeners. Avoid the following products if they are scented: ? Feminine sprays. ? Detergents. ? Tampons. ? Feminine hygiene products. ? Soaps or bubble baths.  Let air reach your genital area. ? Wear cotton underwear to reduce moisture buildup. ? Avoid wearing underwear while you sleep. ? Avoid wearing  tight pants and underwear or nylons without a cotton panel. ? Avoid wearing thong underwear.  Take off any wet clothing, such as bathing suits, as soon as possible.  Practice safe sex and use condoms. Contact a health care provider if:  You have abdominal pain.  You have a fever.  You have symptoms that last for more than 2-3 days. Get help right away if:  You have a fever and your symptoms suddenly get worse. Summary  Vaginitis is a condition in which the vaginal tissue becomes inflamed.This condition is most often caused by a change in the normal balance of bacteria and yeast that live in the vagina.  Treatment varies depending on the type of vaginitis you have.  Do not douche, use tampons , or have sex until your health care provider approves. When you can return to sex, practice safe sex and use condoms. This information is not intended to replace advice given to you by your health care provider. Make sure you discuss any questions you have with your health care provider. Document Released: 08/20/2007 Document Revised: 11/28/2016 Document Reviewed: 11/28/2016 Elsevier Interactive Patient Education  2018  [employer].

## 2018-06-16 LAB — VAGINITIS/VAGINOSIS, DNA PROBE
CANDIDA SPECIES: NEGATIVE
GARDNERELLA VAGINALIS: NEGATIVE
Trichomonas vaginosis: NEGATIVE

## 2018-10-18 DIAGNOSIS — E782 Mixed hyperlipidemia: Secondary | ICD-10-CM | POA: Diagnosis not present

## 2018-10-18 DIAGNOSIS — F411 Generalized anxiety disorder: Secondary | ICD-10-CM | POA: Diagnosis not present

## 2018-10-18 DIAGNOSIS — G4700 Insomnia, unspecified: Secondary | ICD-10-CM | POA: Diagnosis not present

## 2018-10-18 DIAGNOSIS — R5383 Other fatigue: Secondary | ICD-10-CM | POA: Diagnosis not present

## 2018-10-18 DIAGNOSIS — I10 Essential (primary) hypertension: Secondary | ICD-10-CM | POA: Diagnosis not present

## 2018-10-18 DIAGNOSIS — K219 Gastro-esophageal reflux disease without esophagitis: Secondary | ICD-10-CM | POA: Diagnosis not present

## 2018-11-06 HISTORY — PX: OTHER SURGICAL HISTORY: SHX169

## 2018-11-09 DIAGNOSIS — M7989 Other specified soft tissue disorders: Secondary | ICD-10-CM | POA: Diagnosis not present

## 2018-11-09 DIAGNOSIS — M25572 Pain in left ankle and joints of left foot: Secondary | ICD-10-CM | POA: Diagnosis not present

## 2018-11-09 DIAGNOSIS — S82892A Other fracture of left lower leg, initial encounter for closed fracture: Secondary | ICD-10-CM | POA: Diagnosis not present

## 2018-11-09 DIAGNOSIS — S99912A Unspecified injury of left ankle, initial encounter: Secondary | ICD-10-CM | POA: Diagnosis not present

## 2018-11-11 DIAGNOSIS — S8255XA Nondisplaced fracture of medial malleolus of left tibia, initial encounter for closed fracture: Secondary | ICD-10-CM | POA: Diagnosis not present

## 2018-11-29 DIAGNOSIS — S82892A Other fracture of left lower leg, initial encounter for closed fracture: Secondary | ICD-10-CM | POA: Diagnosis not present

## 2018-11-29 DIAGNOSIS — M19072 Primary osteoarthritis, left ankle and foot: Secondary | ICD-10-CM | POA: Diagnosis not present

## 2018-11-29 DIAGNOSIS — S8252XA Displaced fracture of medial malleolus of left tibia, initial encounter for closed fracture: Secondary | ICD-10-CM | POA: Diagnosis not present

## 2018-12-05 DIAGNOSIS — S8252XA Displaced fracture of medial malleolus of left tibia, initial encounter for closed fracture: Secondary | ICD-10-CM | POA: Diagnosis not present

## 2018-12-20 DIAGNOSIS — Z4789 Encounter for other orthopedic aftercare: Secondary | ICD-10-CM | POA: Diagnosis not present

## 2019-01-03 ENCOUNTER — Other Ambulatory Visit: Payer: Self-pay | Admitting: Internal Medicine

## 2019-01-03 DIAGNOSIS — Z1231 Encounter for screening mammogram for malignant neoplasm of breast: Secondary | ICD-10-CM

## 2019-01-06 ENCOUNTER — Telehealth: Payer: Self-pay | Admitting: Obstetrics and Gynecology

## 2019-01-06 NOTE — Telephone Encounter (Signed)
Patient is calling regarding estring. Patient stated that she "cannot find it" to remove it. Patient stated that she has been trying to remove it for 2 weeks and has been unable to.

## 2019-01-06 NOTE — Telephone Encounter (Signed)
I will be happy to see her at 9:30 on Friday, March 6.

## 2019-01-06 NOTE — Telephone Encounter (Signed)
Spoke with patient. Reports Estring was due for removal end of January. Patient states she broke her ankle and forgot about it, has been trying to locate it to remove it for the past 2 wks. Hsa tried several different positions still unable to locate. Patient denies vaginal d/c, bleeding odor, discharge or pelvic pain.   Advised patient OV recommended for further evaluation, patient requesting to schedule on 3/6 with any provider due to her work schedule. Advised I will have to review schedule with Dr. Quincy Simmonds and return call, patient agreeable.   Dr. Quincy Simmonds -please review and advise on 3/6 schedule.

## 2019-01-06 NOTE — Telephone Encounter (Signed)
Spoke with patient. Patient declines OV for 3/6 at 9:30pm, has another appointment that morning, unable to schedule before 1pm. Patient request a Friday afternoon appt.   OV scheduled for 3/13 at 3:30pm with Dr. Quincy Simmonds. Advised Dr. Quincy Simmonds will review, I will return call if any additional recommendations.   Routing to provider for final review. Patient is agreeable to disposition. Will close encounter.

## 2019-01-17 ENCOUNTER — Ambulatory Visit (INDEPENDENT_AMBULATORY_CARE_PROVIDER_SITE_OTHER): Payer: PPO | Admitting: Obstetrics and Gynecology

## 2019-01-17 ENCOUNTER — Other Ambulatory Visit: Payer: Self-pay

## 2019-01-17 ENCOUNTER — Encounter: Payer: Self-pay | Admitting: Obstetrics and Gynecology

## 2019-01-17 VITALS — BP 142/78 | HR 84 | Resp 16 | Ht 63.0 in | Wt 219.0 lb

## 2019-01-17 DIAGNOSIS — N952 Postmenopausal atrophic vaginitis: Secondary | ICD-10-CM | POA: Diagnosis not present

## 2019-01-17 DIAGNOSIS — Z4789 Encounter for other orthopedic aftercare: Secondary | ICD-10-CM | POA: Diagnosis not present

## 2019-01-17 MED ORDER — ESTRADIOL 2 MG VA RING: VAGINAL_RING | VAGINAL | 1 refills | Status: DC

## 2019-01-17 NOTE — Progress Notes (Signed)
GYNECOLOGY  VISIT   HPI: 68 y.o.   Divorced  Caucasian  female   G2P2002 with Patient's last menstrual period was 02/10/1997 (approximate).   here for Estring removal. Unable to find it and this worried her.   Usually removes her Estring.  It was due to be removed in January, 2020.  No bleeding.  No discharge or odor.   Wants an alternative to Estring.   Broke her left ankle this year. Had surgery.  Just got out of her boot.  Using crutches.   GYNECOLOGIC HISTORY: Patient's last menstrual period was 02/10/1997 (approximate). Contraception:  Postmenopausal/tubal ligation Menopausal hormone therapy:  Estring Last mammogram:  11/29/17 BIRADS 1 negative/density b -- scheduled 01/30/19 TBC Last pap smear:   04/12/18 LGSIL:Neg HR HPV.  Neg HPV 16/18.        OB History    Gravida  2   Para  2   Term  2   Preterm  0   AB  0   Living  2     SAB  0   TAB  0   Ectopic  0   Multiple  0   Live Births  2              Patient Active Problem List   Diagnosis Date Noted  . Osteopenia of hip 10/12/2017  . Numbness and tingling in both hands 06/08/2016  . Insomnia 06/08/2016  . Classic migraine with aura 06/08/2016  . Bilateral carpal tunnel syndrome 06/08/2016  . Mixed hyperlipidemia 05/26/2016  . Gastroesophageal reflux disease without esophagitis 05/26/2016  . GAD (generalized anxiety disorder) 05/26/2016  . Left knee pain 05/19/2016  . Acute meniscal tear of left knee 05/19/2016  . FH: pancreatic cancer 12/05/2013  . FH: colon cancer 12/05/2013  . Situational depression 12/05/2013  . Postmenopausal atrophic vaginitis 12/05/2013  . HTN (hypertension) 12/05/2013  . Unspecified vitamin D deficiency 12/05/2013    Past Medical History:  Diagnosis Date  . Hyperlipidemia   . Hypertension   . Tubular adenoma of colon 08/2002, 12 2006  . Vitamin D deficiency     Past Surgical History:  Procedure Laterality Date  . CESAREAN SECTION     x2  . TUBAL LIGATION  Bilateral     Current Outpatient Medications  Medication Sig Dispense Refill  . acyclovir (ZOVIRAX) 200 MG capsule TK 1 C PO FIVE TIMES DAILY  PRN FEVER BLISTERS    . acyclovir ointment (ZOVIRAX) 5 % APPLY AA SIX TIMES PER DAY    . aspirin EC 81 MG tablet Take 81 mg by mouth daily.    . Calcium Carbonate-Vit D-Min (CALCIUM 1200 PO) Take 1 tablet by mouth daily.    . Cyanocobalamin (VITAMIN B 12 PO) Take by mouth daily. Vitamin B12    . desipramine (NORPRAMIN) 50 MG tablet Take 50 mg by mouth daily.    Marland Kitchen ESTRING 2 MG vaginal ring INSERT 1 RING INTRAVAGINALLY EVERY 3 MONTHS. FOLLOW PACKAGE DIRECTIONS 1 each 2  . ezetimibe (ZETIA) 10 MG tablet Take 10 mg by mouth daily.    . hydrochlorothiazide (HYDRODIURIL) 25 MG tablet Take 25 mg by mouth daily.    Marland Kitchen losartan (COZAAR) 50 MG tablet Take 50 mg by mouth daily.    . Multiple Vitamin (MULTIVITAMIN) tablet Take 1 tablet by mouth daily.    Marland Kitchen omeprazole (PRILOSEC) 20 MG capsule Take by mouth.     No current facility-administered medications for this visit.      ALLERGIES:  Atorvastatin  Family History  Problem Relation Age of Onset  . Heart disease Father   . Colon cancer Mother 23  . Pancreatic cancer Brother 20  . Leukemia Maternal Grandfather   . Breast cancer Cousin   . Brain cancer Cousin     Social History   Socioeconomic History  . Marital status: Divorced    Spouse name: Not on file  . Number of children: Not on file  . Years of education: Not on file  . Highest education level: Not on file  Occupational History  . Not on file  Social Needs  . Financial resource strain: Not on file  . Food insecurity:    Worry: Not on file    Inability: Not on file  . Transportation needs:    Medical: Not on file    Non-medical: Not on file  Tobacco Use  . Smoking status: Former Smoker    Packs/day: 0.50    Types: Cigarettes  . Smokeless tobacco: Never Used  Substance and Sexual Activity  . Alcohol use: Yes    Alcohol/week: 1.0  - 2.0 standard drinks    Types: 1 - 2 Standard drinks or equivalent per week  . Drug use: No  . Sexual activity: Not Currently    Partners: Male    Birth control/protection: Surgical    Comment: BTL  Lifestyle  . Physical activity:    Days per week: Not on file    Minutes per session: Not on file  . Stress: Not on file  Relationships  . Social connections:    Talks on phone: Not on file    Gets together: Not on file    Attends religious service: Not on file    Active member of club or organization: Not on file    Attends meetings of clubs or organizations: Not on file    Relationship status: Not on file  . Intimate partner violence:    Fear of current or ex partner: Not on file    Emotionally abused: Not on file    Physically abused: Not on file    Forced sexual activity: Not on file  Other Topics Concern  . Not on file  Social History Narrative  . Not on file    Review of Systems  Constitutional: Negative.   HENT: Negative.   Eyes: Negative.   Respiratory: Negative.   Cardiovascular: Negative.   Gastrointestinal: Negative.   Endocrine: Negative.   Genitourinary:       Night urination  Musculoskeletal: Negative.   Skin: Negative.   Allergic/Immunologic: Negative.   Neurological: Negative.   Hematological: Negative.   Psychiatric/Behavioral: Negative.     PHYSICAL EXAMINATION:    BP (!) 142/78 (BP Location: Right Arm, Patient Position: Sitting, Cuff Size: Large)   Pulse 84   Resp 16   Ht 5\' 3"  (1.6 m)   Wt 219 lb (99.3 kg)   LMP 02/10/1997 (Approximate)   BMI 38.79 kg/m     General appearance: alert, cooperative and appears stated age  Pelvic: External genitalia:  no lesions              Urethra:  normal appearing urethra with no masses, tenderness or lesions              Bartholins and Skenes: normal                 Vagina: normal appearing vagina with normal color and discharge, no lesions  Cervix: no lesions                Bimanual  Exam:  Uterus:  normal size, contour, position, consistency, mobility, non-tender              Adnexa: no mass, fullness, tenderness            Estring removed and discarded.  Chaperone was present for exam.  ASSESSMENT  Vaginal atrophy.  Prolonged Estring use.  Hx LGSIL, neg HR HPV.    PLAN  We reviewed options for vaginal atrophy - Estring, vaginal estrogen cream - name brand or compounded, Vagifem, Osphena.  We will continue with Estring.  1 ring and 1 refill.  She will complete her mammogram.  Return for annual exam and pap in June.  She will bring her Estring with her and I will change it out then.   An After Visit Summary was printed and given to the patient.  ___15___ minutes face to face time of which over 50% was spent in counseling.

## 2019-01-30 ENCOUNTER — Ambulatory Visit: Payer: PPO

## 2019-02-21 DIAGNOSIS — T8484XA Pain due to internal orthopedic prosthetic devices, implants and grafts, initial encounter: Secondary | ICD-10-CM | POA: Diagnosis not present

## 2019-02-21 DIAGNOSIS — M7989 Other specified soft tissue disorders: Secondary | ICD-10-CM | POA: Diagnosis not present

## 2019-02-21 DIAGNOSIS — M1712 Unilateral primary osteoarthritis, left knee: Secondary | ICD-10-CM | POA: Diagnosis not present

## 2019-03-21 DIAGNOSIS — D225 Melanocytic nevi of trunk: Secondary | ICD-10-CM | POA: Diagnosis not present

## 2019-03-21 DIAGNOSIS — D2362 Other benign neoplasm of skin of left upper limb, including shoulder: Secondary | ICD-10-CM | POA: Diagnosis not present

## 2019-03-21 DIAGNOSIS — D2372 Other benign neoplasm of skin of left lower limb, including hip: Secondary | ICD-10-CM | POA: Diagnosis not present

## 2019-03-21 DIAGNOSIS — L821 Other seborrheic keratosis: Secondary | ICD-10-CM | POA: Diagnosis not present

## 2019-03-21 DIAGNOSIS — L309 Dermatitis, unspecified: Secondary | ICD-10-CM | POA: Diagnosis not present

## 2019-03-21 DIAGNOSIS — D485 Neoplasm of uncertain behavior of skin: Secondary | ICD-10-CM | POA: Diagnosis not present

## 2019-03-21 DIAGNOSIS — L918 Other hypertrophic disorders of the skin: Secondary | ICD-10-CM | POA: Diagnosis not present

## 2019-03-27 ENCOUNTER — Ambulatory Visit
Admission: RE | Admit: 2019-03-27 | Discharge: 2019-03-27 | Disposition: A | Discharge status: Home / Self Care | Payer: PPO | Source: Ambulatory Visit | Attending: Internal Medicine | Admitting: Internal Medicine

## 2019-03-27 ENCOUNTER — Other Ambulatory Visit: Payer: Self-pay

## 2019-03-27 DIAGNOSIS — Z1231 Encounter for screening mammogram for malignant neoplasm of breast: Secondary | ICD-10-CM

## 2019-04-18 ENCOUNTER — Ambulatory Visit: Payer: PPO | Admitting: Obstetrics and Gynecology

## 2019-04-28 DIAGNOSIS — Z136 Encounter for screening for cardiovascular disorders: Secondary | ICD-10-CM | POA: Diagnosis not present

## 2019-04-28 DIAGNOSIS — Z23 Encounter for immunization: Secondary | ICD-10-CM | POA: Diagnosis not present

## 2019-04-28 DIAGNOSIS — Z0000 Encounter for general adult medical examination without abnormal findings: Secondary | ICD-10-CM | POA: Diagnosis not present

## 2019-04-28 DIAGNOSIS — K219 Gastro-esophageal reflux disease without esophagitis: Secondary | ICD-10-CM | POA: Diagnosis not present

## 2019-04-28 DIAGNOSIS — R2681 Unsteadiness on feet: Secondary | ICD-10-CM | POA: Diagnosis not present

## 2019-04-28 DIAGNOSIS — F411 Generalized anxiety disorder: Secondary | ICD-10-CM | POA: Diagnosis not present

## 2019-04-28 DIAGNOSIS — M85859 Other specified disorders of bone density and structure, unspecified thigh: Secondary | ICD-10-CM | POA: Diagnosis not present

## 2019-04-28 DIAGNOSIS — E782 Mixed hyperlipidemia: Secondary | ICD-10-CM | POA: Diagnosis not present

## 2019-04-28 DIAGNOSIS — I10 Essential (primary) hypertension: Secondary | ICD-10-CM | POA: Diagnosis not present

## 2019-04-28 DIAGNOSIS — Z1211 Encounter for screening for malignant neoplasm of colon: Secondary | ICD-10-CM | POA: Diagnosis not present

## 2019-04-28 DIAGNOSIS — G43119 Migraine with aura, intractable, without status migrainosus: Secondary | ICD-10-CM | POA: Diagnosis not present

## 2019-05-19 ENCOUNTER — Ambulatory Visit: Payer: PPO | Admitting: Obstetrics and Gynecology

## 2019-06-09 DIAGNOSIS — G43009 Migraine without aura, not intractable, without status migrainosus: Secondary | ICD-10-CM | POA: Diagnosis not present

## 2019-06-09 DIAGNOSIS — G5603 Carpal tunnel syndrome, bilateral upper limbs: Secondary | ICD-10-CM | POA: Diagnosis not present

## 2019-07-21 ENCOUNTER — Other Ambulatory Visit: Payer: Self-pay

## 2019-07-21 ENCOUNTER — Other Ambulatory Visit (HOSPITAL_COMMUNITY)
Admission: RE | Admit: 2019-07-21 | Discharge: 2019-07-21 | Disposition: A | Discharge status: Home / Self Care | Payer: PPO | Source: Ambulatory Visit | Attending: Obstetrics and Gynecology | Admitting: Obstetrics and Gynecology

## 2019-07-21 ENCOUNTER — Encounter: Payer: Self-pay | Admitting: Obstetrics and Gynecology

## 2019-07-21 ENCOUNTER — Encounter

## 2019-07-21 ENCOUNTER — Ambulatory Visit (INDEPENDENT_AMBULATORY_CARE_PROVIDER_SITE_OTHER): Payer: PPO | Admitting: Obstetrics and Gynecology

## 2019-07-21 VITALS — BP 160/84 | HR 80 | Temp 97.4°F | Resp 16 | Ht 63.0 in | Wt 213.0 lb

## 2019-07-21 DIAGNOSIS — M810 Age-related osteoporosis without current pathological fracture: Secondary | ICD-10-CM

## 2019-07-21 DIAGNOSIS — Z8742 Personal history of other diseases of the female genital tract: Secondary | ICD-10-CM | POA: Insufficient documentation

## 2019-07-21 DIAGNOSIS — Z01419 Encounter for gynecological examination (general) (routine) without abnormal findings: Secondary | ICD-10-CM

## 2019-07-21 MED ORDER — ESTRING 2 MG VA RING: VAGINAL_RING | VAGINAL | 2 refills | Status: DC

## 2019-07-21 NOTE — Patient Instructions (Signed)

## 2019-07-21 NOTE — Progress Notes (Signed)
68 y.o. G64P2002 [name] here for annual exam.    She took her Estring out at least 4 weeks ago.  She would like to continue with this.  Broke her ankle at the beginning of the year.  She fell after her knee gave out.  She was dx with arthritis on x-ray at the time.   Vit D 49 on 04/28/19.   Working 3 days a week.   PCP: Brittany Mango, MD    Patient's last menstrual period was 02/10/1997 (approximate).           Sexually active: No.  The current method of family planning is tubal ligation.    Exercising: No.  The patient does not participate in regular exercise at present. Smoker:  Former  Health Maintenance: Pap: 04-15-18 LGSIL:Neg HR HPV, 02-25-16 Neg:Neg HR HPV History of abnormal Pap:  Yes,04-15-18 LGSIL:Neg HR HPV  MMG: 03-27-19 3D Neg/density B/BiRads1 Colonoscopy: 05-29-14 hyperplastic polyp;next 5 years--Has consult appt. 08-15-19 BMD: 06-16-16  Result :Osteopenia Lt.hip, borderline Osteoporosis Rt.hip TDaP: 02-25-16 Gardasil:   n/a  HIV:02-25-16 NR Hep C:02-25-16 Neg Screening Labs: PCP.   reports that she has quit smoking. Her smoking use included cigarettes. She smoked 0.50 packs per day. She has never used smokeless tobacco. She reports current alcohol use of about 1.0 - 2.0 standard drinks of alcohol per week. She reports that she does not use drugs.  Past Medical History:  Diagnosis Date  . Hyperlipidemia   . Hypertension   . Tubular adenoma of colon 08/2002, 12 2006  . Vitamin D deficiency     Past Surgical History:  Procedure Laterality Date  . BREAST BIOPSY     leftr breast benign more than 20 yrs ago   . broken ankle repair Left 11/2018  . CESAREAN SECTION     x2  . TUBAL LIGATION Bilateral     Current Outpatient Medications  Medication Sig Dispense Refill  . acyclovir ointment (ZOVIRAX) 5 % APPLY AA SIX TIMES PER DAY    . aspirin EC 81 MG tablet Take 81 mg by mouth daily.    . Calcium Carbonate-Vit D-Min (CALCIUM 1200 PO) Take 1 tablet  by mouth daily.    . Cholecalciferol (VITAMIN D3) 125 MCG (5000 UT) CAPS Take 1 capsule by mouth daily.    Marland Kitchen desipramine (NORPRAMIN) 50 MG tablet Take 50 mg by mouth daily.    Marland Kitchen estradiol (ESTRING) 2 MG vaginal ring INSERT 1 RING INTRAVAGINALLY EVERY 3 MONTHS. FOLLOW PACKAGE DIRECTIONS 1 each 1  . ezetimibe (ZETIA) 10 MG tablet Take 10 mg by mouth daily.    . hydrochlorothiazide (HYDRODIURIL) 25 MG tablet Take 25 mg by mouth daily.    Marland Kitchen losartan (COZAAR) 50 MG tablet Take 50 mg by mouth daily.    . mometasone (ELOCON) 0.1 % cream Apply 1 application topically as needed.    . Multiple Vitamin (MULTIVITAMIN) tablet Take 1 tablet by mouth daily.    Marland Kitchen omeprazole (PRILOSEC) 20 MG capsule Take by mouth.     No current facility-administered medications for this visit.     Family History  Problem Relation Age of Onset  . Heart disease Father   . Colon cancer Mother 43  . Pancreatic cancer Brother 66  . Leukemia Maternal Grandfather   . Breast cancer Cousin   . Brain cancer Cousin     Review of Systems  All other systems reviewed and are negative.   Exam:   BP (!) 160/84 (Cuff Size: Large)  Pulse 80   Temp (!) 97.4 F (36.3 C) (Temporal)   Resp 16   Ht 5\' 3"  (1.6 m)   Wt 213 lb (96.6 kg)   LMP 02/10/1997 (Approximate)   BMI 37.73 kg/m     General appearance: alert, cooperative and appears stated age Head: normocephalic, without obvious abnormality, atraumatic Neck: no adenopathy, supple, symmetrical, trachea midline and thyroid normal to inspection and palpation Lungs: clear to auscultation bilaterally Breasts: normal appearance, no masses or tenderness, No nipple retraction or dimpling, No nipple discharge or bleeding, No axillary adenopathy Heart: regular rate and rhythm Abdomen: soft, non-tender; no masses, no organomegaly Extremities: extremities normal, atraumatic, no cyanosis or edema Skin: skin color, texture, turgor normal. No rashes or lesions Lymph nodes: cervical,  supraclavicular, and axillary nodes normal. Neurologic: grossly normal  Pelvic: External genitalia:  no lesions              No abnormal inguinal nodes palpated.              Urethra:  normal appearing urethra with no masses, tenderness or lesions              Bartholins and Skenes: normal                 Vagina: normal appearing vagina with normal color and discharge, no lesions              Cervix: no lesions              Pap taken: Yes.   Bimanual Exam:  Uterus:  normal size, contour, position, consistency, mobility, non-tender              Adnexa: no mass, fullness, tenderness              Rectal exam: Yes.  .  Confirms.              Anus:  normal sphincter tone, no lesions  Estring placed for the patient.  Chaperone was present for exam.  Assessment:   Well woman visit with normal exam. Vaginal atrophy.  Hx LGSIL pap.  Osteoporosis.   Plan: Mammogram screening discussed. Self breast awareness reviewed. Pap and HR HPV as above. Guidelines for Calcium, Vitamin D, regular exercise program including cardiovascular and weight bearing exercise. Refill of Estring for one year.   I discussed potential effect on breast cancer.  Return in 90 days for exchange of the Estring per patient's request. BMD at [facility] She will schedule.  Flu vaccine recommended.  Follow up annually and prn.   After visit summary provided.

## 2019-07-23 LAB — CYTOLOGY - PAP: Diagnosis: NEGATIVE

## 2019-08-09 DIAGNOSIS — Z20828 Contact with and (suspected) exposure to other viral communicable diseases: Secondary | ICD-10-CM | POA: Diagnosis not present

## 2019-09-01 DIAGNOSIS — F411 Generalized anxiety disorder: Secondary | ICD-10-CM | POA: Diagnosis not present

## 2019-09-01 DIAGNOSIS — I10 Essential (primary) hypertension: Secondary | ICD-10-CM | POA: Diagnosis not present

## 2019-09-01 DIAGNOSIS — T8484XA Pain due to internal orthopedic prosthetic devices, implants and grafts, initial encounter: Secondary | ICD-10-CM | POA: Diagnosis not present

## 2019-09-01 DIAGNOSIS — G43119 Migraine with aura, intractable, without status migrainosus: Secondary | ICD-10-CM | POA: Diagnosis not present

## 2019-09-01 DIAGNOSIS — G4700 Insomnia, unspecified: Secondary | ICD-10-CM | POA: Diagnosis not present

## 2019-09-01 DIAGNOSIS — E782 Mixed hyperlipidemia: Secondary | ICD-10-CM | POA: Diagnosis not present

## 2019-09-01 DIAGNOSIS — M85859 Other specified disorders of bone density and structure, unspecified thigh: Secondary | ICD-10-CM | POA: Diagnosis not present

## 2019-09-01 DIAGNOSIS — K219 Gastro-esophageal reflux disease without esophagitis: Secondary | ICD-10-CM | POA: Diagnosis not present

## 2019-09-05 DIAGNOSIS — Z86010 Personal history of colonic polyps: Secondary | ICD-10-CM | POA: Diagnosis not present

## 2019-09-05 DIAGNOSIS — K648 Other hemorrhoids: Secondary | ICD-10-CM | POA: Diagnosis not present

## 2019-09-05 DIAGNOSIS — K649 Unspecified hemorrhoids: Secondary | ICD-10-CM | POA: Diagnosis not present

## 2019-09-05 DIAGNOSIS — Z800 Family history of malignant neoplasm of digestive organs: Secondary | ICD-10-CM | POA: Diagnosis not present

## 2019-09-05 DIAGNOSIS — Z1211 Encounter for screening for malignant neoplasm of colon: Secondary | ICD-10-CM | POA: Diagnosis not present

## 2019-09-11 DIAGNOSIS — E782 Mixed hyperlipidemia: Secondary | ICD-10-CM | POA: Diagnosis not present

## 2019-09-11 DIAGNOSIS — I10 Essential (primary) hypertension: Secondary | ICD-10-CM | POA: Diagnosis not present

## 2019-10-09 ENCOUNTER — Ambulatory Visit
Admission: RE | Admit: 2019-10-09 | Discharge: 2019-10-09 | Disposition: A | Discharge status: Home / Self Care | Payer: PPO | Source: Ambulatory Visit | Attending: Obstetrics and Gynecology | Admitting: Obstetrics and Gynecology

## 2019-10-09 ENCOUNTER — Other Ambulatory Visit: Payer: Self-pay

## 2019-10-09 DIAGNOSIS — M85851 Other specified disorders of bone density and structure, right thigh: Secondary | ICD-10-CM | POA: Diagnosis not present

## 2019-10-09 DIAGNOSIS — Z780 Asymptomatic menopausal state: Secondary | ICD-10-CM | POA: Diagnosis not present

## 2019-10-09 DIAGNOSIS — M810 Age-related osteoporosis without current pathological fracture: Secondary | ICD-10-CM

## 2019-10-20 ENCOUNTER — Encounter: Payer: Self-pay | Admitting: Obstetrics and Gynecology

## 2019-10-20 ENCOUNTER — Other Ambulatory Visit: Payer: Self-pay

## 2019-10-20 ENCOUNTER — Ambulatory Visit (INDEPENDENT_AMBULATORY_CARE_PROVIDER_SITE_OTHER): Payer: PPO | Admitting: Obstetrics and Gynecology

## 2019-10-20 VITALS — BP 140/82 | HR 88 | Temp 97.2°F | Ht 63.0 in | Wt 218.0 lb

## 2019-10-20 DIAGNOSIS — N952 Postmenopausal atrophic vaginitis: Secondary | ICD-10-CM | POA: Diagnosis not present

## 2019-10-20 DIAGNOSIS — M8580 Other specified disorders of bone density and structure, unspecified site: Secondary | ICD-10-CM | POA: Diagnosis not present

## 2019-10-20 NOTE — Patient Instructions (Signed)
Estradiol vaginal insert (Invexxy) °What is this medicine? °ESTRADIOL (es tra DYE ole) vaginal insert is used to treat females who experience painful sexual intercourse, a symptom of menopause that occurs due to changes in and around the vagina. °This medicine may be used for other purposes; ask your health care provider or pharmacist if you have questions. °COMMON BRAND NAME(S): Imvexxy, Vagifem, Yuvafem °What should I tell my health care provider before I take this medicine? °They need to know if you have any of these conditions: °· abnormal vaginal bleeding °· blood vessel disease or blood clots °· breast, cervical, endometrial, ovarian, liver, or uterine cancer °· dementia °· diabetes °· gallbladder disease °· heart disease or recent heart attack °· high blood pressure °· high cholesterol °· high level of calcium in the blood °· hysterectomy °· kidney disease °· liver disease °· migraine headaches °· protein C deficiency °· protein S deficiency °· stroke °· systemic lupus erythematosus (SLE) °· tobacco smoker °· an unusual or allergic reaction to estrogens, other hormones, medicines, foods, dyes, or preservatives °· pregnant or trying to get pregnant °· breast-feeding °How should I use this medicine? °This medicine is for vaginal use only. Do not take by mouth. Follow the directions on the prescription label. Read package directions carefully before using. Wash hands before and after use. Use this medicine at bedtime. Do not use it more often than directed. Do not stop using except on your doctor's advice. °Talk to your pediatrician regarding the use of this medicine in children. This medicine is not approved for use in children. °Overdosage: If you think you have taken too much of this medicine contact a poison control center or emergency room at once. °NOTE: This medicine is only for you. Do not share this medicine with others. °What if I miss a dose? °If you miss a dose, use it as soon as you can. If it is  almost time for your next dose, use only that dose. Do not use double or extra doses. °What may interact with this medicine? °Do not take this medicine with any of the following medications: °· aromatase inhibitors like aminoglutethimide, anastrozole, exemestane, letrozole, testolactone °This medicine may also interact with the following medications: °· antibiotics used to treat tuberculosis like rifabutin, rifampin and rifapentene °· raloxifene or tamoxifen °· warfarin °This list may not describe all possible interactions. Give your health care provider a list of all the medicines, herbs, non-prescription drugs, or dietary supplements you use. Also tell them if you smoke, drink alcohol, or use illegal drugs. Some items may interact with your medicine. °What should I watch for while using this medicine? °Visit your health care professional for regular checks on your progress. You will need a regular breast and pelvic exam. You should also discuss the need for regular mammograms with your health care professional, and follow his or her guidelines. °This medicine can make your body retain fluid, making your fingers, hands, or ankles swell. Your blood pressure can go up. Contact your doctor or health care professional if you feel you are retaining fluid. °Tobacco smoking increases the risk of getting a blood clot or having a stroke, especially if you are more than 67 years old. You are strongly advised not to smoke. °If you wear contact lenses and notice visual changes, or if the lenses begin to feel uncomfortable, consult your eye care specialist. °If you are going to have elective surgery, you may need to stop taking this medicine beforehand. Consult your health care   professional for advice prior to scheduling the surgery. °If you have any reason to think you are pregnant; stop taking this medicine at once and contact your doctor or health care professional. °What side effects may I notice from receiving this  medicine? °Side effects that you should report to your doctor or health care professional as soon as possible: °· allergic reactions like skin rash, itching or hives, swelling of the face, lips, or tongue °· breast lumps, tissue changes, or discharge °· increased blood pressure °· signs and symptoms of liver injury like dark yellow or brown urine; general ill feeling or flu-like symptoms; light-colored stools; loss of appetite; nausea; right upper belly pain; unusually weak or tired; yellowing of the eyes or skin °· signs and symptoms of a blood clot such as breathing problems; changes in vision or speech; chest pain; severe, sudden headache; pain, swelling, warmth in the leg; trouble speaking; sudden numbness or weakness of the face, arm or leg °· swelling in your ankles, legs, or feet °· vaginal discharge, itching, or odor °· unusual vaginal bleeding °Side effects that usually do not require medical attention (report these to your doctor or health care professional if they continue or are bothersome): °· breast tenderness or pain °· headache °· nausea °This list may not describe all possible side effects. Call your doctor for medical advice about side effects. You may report side effects to FDA at 1-800-FDA-1088. °Where should I keep my medicine? °Keep out of the reach of children. °Store at room temperature between 15 and 30 degrees C (59 and 86 degrees F). Throw away any unused medicine after the expiration date. °NOTE: This sheet is a summary. It may not cover all possible information. If you have questions about this medicine, talk to your doctor, pharmacist, or health care provider. °© 2020 [employer] (2017-04-13 14:10:39) ° °

## 2019-10-20 NOTE — Progress Notes (Signed)
GYNECOLOGY  VISIT   HPI: 68 y.o.   Divorced  Caucasian  female   G2P2002 with Patient's last menstrual period was 02/10/1997 (approximate).   here for 3 month Estring exchange.   She uses the ring for vaginal dryness.  She will put her own Estring in.   Her last BMD shows osteopenia.  This is improved from her prior which showed osteoporosis. Her right hip T score went from -2.5 to -1.4. By her FRAX model, her hip fracture risk is 0.9% and her major osteoporotic fracture risk is 8.3%.   GYNECOLOGIC HISTORY: Patient's last menstrual period was 02/10/1997 (approximate). Contraception:  Tubal  Menopausal hormone therapy:  Estring Last mammogram: 03-27-19 3D/Neg/density B/BiRads1 Last pap smear:  07-21-19 Neg, 04-15-18 LGSIL:Neg HR HPV, 02-25-16 Neg:Neg HR HPV        OB History    Gravida  2   Para  2   Term  2   Preterm  0   AB  0   Living  2     SAB  0   TAB  0   Ectopic  0   Multiple  0   Live Births  2              Patient Active Problem List   Diagnosis Date Noted  . Osteopenia of hip 10/12/2017  . Numbness and tingling in both hands 06/08/2016  . Insomnia 06/08/2016  . Classic migraine with aura 06/08/2016  . Bilateral carpal tunnel syndrome 06/08/2016  . Mixed hyperlipidemia 05/26/2016  . Gastroesophageal reflux disease without esophagitis 05/26/2016  . GAD (generalized anxiety disorder) 05/26/2016  . Left knee pain 05/19/2016  . Acute meniscal tear of left knee 05/19/2016  . FH: pancreatic cancer 12/05/2013  . FH: colon cancer 12/05/2013  . Situational depression 12/05/2013  . Postmenopausal atrophic vaginitis 12/05/2013  . HTN (hypertension) 12/05/2013  . Unspecified vitamin D deficiency 12/05/2013    Past Medical History:  Diagnosis Date  . Hyperlipidemia   . Hypertension   . Tubular adenoma of colon 08/2002, 12 2006  . Vitamin D deficiency     Past Surgical History:  Procedure Laterality Date  . BREAST BIOPSY     leftr breast  benign more than 20 yrs ago   . broken ankle repair Left 11/2018  . CESAREAN SECTION     x2  . TUBAL LIGATION Bilateral     Current Outpatient Medications  Medication Sig Dispense Refill  . aspirin EC 81 MG tablet Take 81 mg by mouth daily.    . Calcium Carbonate-Vit D-Min (CALCIUM 1200 PO) Take 1 tablet by mouth daily.    . Cholecalciferol (VITAMIN D3) 125 MCG (5000 UT) CAPS Take 1 capsule by mouth daily.    Marland Kitchen desipramine (NORPRAMIN) 50 MG tablet Take 50 mg by mouth daily.    Marland Kitchen estradiol (ESTRING) 2 MG vaginal ring INSERT 1 RING INTRAVAGINALLY EVERY 3 MONTHS. FOLLOW PACKAGE DIRECTIONS 1 each 2  . ezetimibe (ZETIA) 10 MG tablet Take 10 mg by mouth daily.    . hydrochlorothiazide (HYDRODIURIL) 25 MG tablet Take 25 mg by mouth daily.    Marland Kitchen losartan (COZAAR) 50 MG tablet Take 50 mg by mouth daily.    . mometasone (ELOCON) 0.1 % cream Apply 1 application topically as needed.    . Multiple Vitamin (MULTIVITAMIN) tablet Take 1 tablet by mouth daily.    Marland Kitchen omeprazole (PRILOSEC) 20 MG capsule Take by mouth.    Marland Kitchen acyclovir ointment (ZOVIRAX) 5 % Apply  1 application topically as needed.      No current facility-administered medications for this visit.     ALLERGIES: Atorvastatin  Family History  Problem Relation Age of Onset  . Heart disease Father   . Colon cancer Mother 39  . Pancreatic cancer Brother 21  . Leukemia Maternal Grandfather   . Breast cancer Cousin   . Brain cancer Cousin     Social History   Socioeconomic History  . Marital status: Divorced    Spouse name: Not on file  . Number of children: Not on file  . Years of education: Not on file  . Highest education level: Not on file  Occupational History  . Not on file  Tobacco Use  . Smoking status: Former Smoker    Packs/day: 0.50    Types: Cigarettes  . Smokeless tobacco: Never Used  Substance and Sexual Activity  . Alcohol use: Yes    Alcohol/week: 1.0 - 2.0 standard drinks    Types: 1 - 2 Standard drinks or  equivalent per week  . Drug use: No  . Sexual activity: Not Currently    Partners: Male    Birth control/protection: Surgical    Comment: BTL  Other Topics Concern  . Not on file  Social History Narrative  . Not on file   Social Determinants of Health   Financial Resource Strain:   . Difficulty of Paying Living Expenses: Not on file  Food Insecurity:   . Worried About Charity fundraiser in the Last Year: Not on file  . Ran Out of Food in the Last Year: Not on file  Transportation Needs:   . Lack of Transportation (Medical): Not on file  . Lack of Transportation (Non-Medical): Not on file  Physical Activity:   . Days of Exercise per Week: Not on file  . Minutes of Exercise per Session: Not on file  Stress:   . Feeling of Stress : Not on file  Social Connections:   . Frequency of Communication with Friends and Family: Not on file  . Frequency of Social Gatherings with Friends and Family: Not on file  . Attends Religious Services: Not on file  . Active Member of Clubs or Organizations: Not on file  . Attends Archivist Meetings: Not on file  . Marital Status: Not on file  Intimate Partner Violence:   . Fear of Current or Ex-Partner: Not on file  . Emotionally Abused: Not on file  . Physically Abused: Not on file  . Sexually Abused: Not on file    Review of Systems  All other systems reviewed and are negative.   PHYSICAL EXAMINATION:    BP 140/82 (Cuff Size: Large)   Pulse 88   Temp (!) 97.2 F (36.2 C) (Temporal)   Ht 5\' 3"  (1.6 m)   Wt 218 lb (98.9 kg)   LMP 02/10/1997 (Approximate)   BMI 38.62 kg/m     General appearance: alert, cooperative and appears stated age  Pelvic: External genitalia:  no lesions              Urethra:  normal appearing urethra with no masses, tenderness or lesions              Bartholins and Skenes: normal                 Vagina: normal appearing vagina with normal color and discharge, no lesions  Cervix: no  lesions                Bimanual Exam:  Uterus:  normal size, contour, position, consistency, mobility, non-tender              Adnexa: no mass, fullness, tenderness          Estring removed with ring forceps and discarded.   Chaperone was present for exam.  ASSESSMENT  Vaginal atrophy.  Estring patient.  Osteopenia.   PLAN  Estring removed.  We discussed Vagifem as a potential alternative to Estring.  She will do some research on cost.  Written information given and instructed in use.  We reviewed her BMD and osteopenia.   I recommended weight bearing activity, calcium, and vitamin D. BMD will be due in 2022.  Fu in 3 months for next Estring removal.    An After Visit Summary was printed and given to the patient.  __15____ minutes face to face time of which over 50% was spent in counseling.

## 2020-01-16 ENCOUNTER — Other Ambulatory Visit: Payer: Self-pay

## 2020-01-19 ENCOUNTER — Ambulatory Visit: Payer: Medicare Other | Admitting: Obstetrics and Gynecology

## 2020-01-19 ENCOUNTER — Other Ambulatory Visit: Payer: Self-pay

## 2020-01-19 ENCOUNTER — Encounter: Payer: Self-pay | Admitting: Obstetrics and Gynecology

## 2020-01-19 ENCOUNTER — Ambulatory Visit: Payer: PPO | Admitting: Obstetrics and Gynecology

## 2020-01-19 VITALS — BP 158/70 | HR 100 | Temp 96.9°F | Ht 63.0 in | Wt 223.9 lb

## 2020-01-19 DIAGNOSIS — N952 Postmenopausal atrophic vaginitis: Secondary | ICD-10-CM | POA: Diagnosis not present

## 2020-01-19 MED ORDER — ESTRADIOL 10 MCG VA TABS: 1.0000 | ORAL_TABLET | VAGINAL | 6 refills | Status: DC

## 2020-01-19 NOTE — Progress Notes (Signed)
GYNECOLOGY  VISIT   HPI: 69 y.o.   Divorced  Caucasian  female   G2P2002 with Patient's last menstrual period was 02/10/1997 (approximate).   here for Estring removal.  She is interested to try vaginal estradiol tablets.   Not sexually active but she has dryness if she is not using vaginal estrogen.   Will get her Covid vaccine next week.   GYNECOLOGIC HISTORY: Patient's last menstrual period was 02/10/1997 (approximate). Contraception:  Tubal Menopausal hormone therapy:  Estring Last mammogram:  03-27-19 3D/Neg/density B/BiRads1 Last pap smear:  07-21-19 Neg,04-15-18 LGSIL:Neg HR HPV, 02-25-16 Neg:Neg HR HPV        OB History    Gravida  2   Para  2   Term  2   Preterm  0   AB  0   Living  2     SAB  0   TAB  0   Ectopic  0   Multiple  0   Live Births  2              Patient Active Problem List   Diagnosis Date Noted  . Osteopenia of hip 10/12/2017  . Numbness and tingling in both hands 06/08/2016  . Insomnia 06/08/2016  . Classic migraine with aura 06/08/2016  . Bilateral carpal tunnel syndrome 06/08/2016  . Mixed hyperlipidemia 05/26/2016  . Gastroesophageal reflux disease without esophagitis 05/26/2016  . GAD (generalized anxiety disorder) 05/26/2016  . Left knee pain 05/19/2016  . Acute meniscal tear of left knee 05/19/2016  . FH: pancreatic cancer 12/05/2013  . FH: colon cancer 12/05/2013  . Situational depression 12/05/2013  . Postmenopausal atrophic vaginitis 12/05/2013  . HTN (hypertension) 12/05/2013  . Unspecified vitamin D deficiency 12/05/2013    Past Medical History:  Diagnosis Date  . Hyperlipidemia   . Hypertension   . Tubular adenoma of colon 08/2002, 12 2006  . Vitamin D deficiency     Past Surgical History:  Procedure Laterality Date  . BREAST BIOPSY     leftr breast benign more than 20 yrs ago   . broken ankle repair Left 11/2018  . CESAREAN SECTION     x2  . TUBAL LIGATION Bilateral     Current Outpatient  Medications  Medication Sig Dispense Refill  . aspirin EC 81 MG tablet Take 81 mg by mouth daily.    . Calcium Carbonate-Vit D-Min (CALCIUM 1200 PO) Take 1 tablet by mouth daily.    . Cholecalciferol (VITAMIN D3) 125 MCG (5000 UT) CAPS Take 1 capsule by mouth daily.    Marland Kitchen desipramine (NORPRAMIN) 50 MG tablet Take 50 mg by mouth daily.    Marland Kitchen estradiol (ESTRING) 2 MG vaginal ring INSERT 1 RING INTRAVAGINALLY EVERY 3 MONTHS. FOLLOW PACKAGE DIRECTIONS 1 each 2  . ezetimibe (ZETIA) 10 MG tablet Take 10 mg by mouth daily.    . hydrochlorothiazide (HYDRODIURIL) 25 MG tablet Take 25 mg by mouth daily.    Marland Kitchen losartan (COZAAR) 50 MG tablet Take 50 mg by mouth daily.    . mometasone (ELOCON) 0.1 % cream Apply 1 application topically as needed.    . Multiple Vitamin (MULTIVITAMIN) tablet Take 1 tablet by mouth daily.    Marland Kitchen omeprazole (PRILOSEC) 20 MG capsule Take by mouth.     No current facility-administered medications for this visit.     ALLERGIES: Atorvastatin  Family History  Problem Relation Age of Onset  . Heart disease Father   . Colon cancer Mother 8  . Pancreatic  cancer Brother 73  . Leukemia Maternal Grandfather   . Breast cancer Cousin   . Brain cancer Cousin     Social History   Socioeconomic History  . Marital status: Divorced    Spouse name: Not on file  . Number of children: Not on file  . Years of education: Not on file  . Highest education level: Not on file  Occupational History  . Not on file  Tobacco Use  . Smoking status: Former Smoker    Packs/day: 0.50    Types: Cigarettes  . Smokeless tobacco: Never Used  Substance and Sexual Activity  . Alcohol use: Yes    Alcohol/week: 1.0 - 2.0 standard drinks    Types: 1 - 2 Standard drinks or equivalent per week  . Drug use: No  . Sexual activity: Not Currently    Partners: Male    Birth control/protection: Surgical    Comment: BTL  Other Topics Concern  . Not on file  Social History Narrative  . Not on file    Social Determinants of Health   Financial Resource Strain:   . Difficulty of Paying Living Expenses:   Food Insecurity:   . Worried About Charity fundraiser in the Last Year:   . Arboriculturist in the Last Year:   Transportation Needs:   . Film/video editor (Medical):   Marland Kitchen Lack of Transportation (Non-Medical):   Physical Activity:   . Days of Exercise per Week:   . Minutes of Exercise per Session:   Stress:   . Feeling of Stress :   Social Connections:   . Frequency of Communication with Friends and Family:   . Frequency of Social Gatherings with Friends and Family:   . Attends Religious Services:   . Active Member of Clubs or Organizations:   . Attends Archivist Meetings:   Marland Kitchen Marital Status:   Intimate Partner Violence:   . Fear of Current or Ex-Partner:   . Emotionally Abused:   Marland Kitchen Physically Abused:   . Sexually Abused:     Review of Systems  All other systems reviewed and are negative.   PHYSICAL EXAMINATION:    BP (!) 158/70 (Cuff Size: Large)   Pulse 100   Temp (!) 96.9 F (36.1 C) (Temporal)   Ht 5\' 3"  (1.6 m)   Wt 223 lb 14.4 oz (101.6 kg)   LMP 02/10/1997 (Approximate)   BMI 39.66 kg/m     General appearance: alert, cooperative and appears stated age   Pelvic: External genitalia:  no lesions              Urethra:  normal appearing urethra with no masses, tenderness or lesions              Bartholins and Skenes: normal                 Vagina: normal appearing vagina with normal color and discharge, no lesions              Cervix: no lesions.  Estring removed and discarded.                Bimanual Exam:  Uterus:  normal size, contour, position, consistency, mobility, non-tender              Adnexa: no mass, fullness, tenderness        Chaperone was present for exam.  ASSESSMENT  Vaginal atrophy.   PLAN  Stop Estring.  We  discussed options of vaginal estrogen cream or vaginal estradiol tablets.  She will switch to Estradiol 10  mcg twice weekly. #8, RF 6. Mammogram is due in May, 2021. Annual exam due in September.    An After Visit Summary was printed and given to the patient.  __20____ minutes face to face time of which over 50% was spent in counseling.

## 2020-01-27 ENCOUNTER — Encounter: Payer: Self-pay | Admitting: Certified Nurse Midwife

## 2020-02-26 ENCOUNTER — Other Ambulatory Visit: Payer: Self-pay | Admitting: Internal Medicine

## 2020-02-26 DIAGNOSIS — Z1231 Encounter for screening mammogram for malignant neoplasm of breast: Secondary | ICD-10-CM

## 2020-03-29 ENCOUNTER — Other Ambulatory Visit: Payer: Self-pay

## 2020-03-29 ENCOUNTER — Ambulatory Visit
Admission: RE | Admit: 2020-03-29 | Discharge: 2020-03-29 | Disposition: A | Discharge status: Home / Self Care | Payer: Medicare Other | Source: Ambulatory Visit | Attending: Internal Medicine | Admitting: Internal Medicine

## 2020-03-29 DIAGNOSIS — Z1231 Encounter for screening mammogram for malignant neoplasm of breast: Secondary | ICD-10-CM

## 2020-07-21 NOTE — Progress Notes (Signed)
[name] here for annual exam.    Has some bladder urgency recently, but is resolved.   Some vaginal discharge.  Some odor.  No itching.  She is using Vagifem, and it is uncomfortable. She wants to try vaginal estrogen cream.   Some ankle swelling.  Plans to start working on her weight.   Received her Covid vaccine.   Has retired from work.  New grandchild born in June, 2021.  [employer] is next weekend.   Will see her PCP in one month.   PCP:  Brittany Mango, MD   Patient's last menstrual period was 02/10/1997 (approximate).           Sexually active: Yes.    The current method of family planning is tubal ligation.    Exercising: No.  The patient does not participate in regular exercise at present. Smoker:  Former  Health Maintenance: Pap: 07-21-19 Neg,  04-15-18 LGSIL:Neg HR HPV, 02-25-16 Neg:Neg HR HPV History of abnormal Pap:  Yes, LGSIL 04-15-18:Neg HR HPV;neg 16/18 MMG: 03-29-20 3D/Neg/density B/Birads1 Colonoscopy: 10/2019 normal;5 yrs--mom with colon ca BMD:  10-09-19 Result :Osteopenia now--improved from Osteoporosis TDaP: 02-25-16 Gardasil:   no HIV: 02-25-16 NR Hep C:02-25-16 Neg Screening Labs:  PCP.   reports that she has quit smoking. Her smoking use included cigarettes. She smoked 0.50 packs per day. She has never used smokeless tobacco. She reports current alcohol use of about 1.0 standard drink of alcohol per week. She reports that she does not use drugs.  Past Medical History:  Diagnosis Date   Hyperlipidemia    Hypertension    Tubular adenoma of colon 08/2002, 12 2006   Vitamin D deficiency     Past Surgical History:  Procedure Laterality Date   BREAST BIOPSY     leftr breast benign more than 20 yrs ago    broken ankle repair Left 11/2018   CESAREAN SECTION     x2   TUBAL LIGATION Bilateral     Current Outpatient Medications  Medication Sig Dispense Refill   aspirin EC 81 MG tablet Take 81 mg by mouth  daily.     Calcium Carbonate-Vit D-Min (CALCIUM 1200 PO) Take 1 tablet by mouth daily.     Cholecalciferol (VITAMIN D3) 125 MCG (5000 UT) CAPS Take 1 capsule by mouth daily.     desipramine (NORPRAMIN) 50 MG tablet Take 50 mg by mouth daily.     Estradiol 10 MCG TABS vaginal tablet Place 1 tablet (10 mcg total) vaginally 2 (two) times a week. 8 tablet 6   ezetimibe (ZETIA) 10 MG tablet Take 10 mg by mouth daily.     hydrochlorothiazide (HYDRODIURIL) 25 MG tablet Take 25 mg by mouth daily.     losartan (COZAAR) 50 MG tablet Take 50 mg by mouth daily.     mometasone (ELOCON) 0.1 % cream Apply 1 application topically as needed.     Multiple Vitamin (MULTIVITAMIN) tablet Take 1 tablet by mouth daily.     omeprazole (PRILOSEC) 20 MG capsule Take by mouth.     No current facility-administered medications for this visit.    Family History  Problem Relation Age of Onset   Heart disease Father    Colon cancer Mother 43   Pancreatic cancer Brother 95   Leukemia Maternal Grandfather    Breast cancer Cousin    Brain cancer Cousin     Review of Systems  All other systems reviewed and are negative.   Exam:  BP (!) 178/80 (Cuff Size: Large)    Pulse 90    Resp 16    Ht 5\' 3"  (1.6 m)    Wt 225 lb (102.1 kg)    LMP 02/10/1997 (Approximate)    BMI 39.86 kg/m     General appearance: alert, cooperative and appears stated age Head: normocephalic, without obvious abnormality, atraumatic Neck: no adenopathy, supple, symmetrical, trachea midline and thyroid normal to inspection and palpation Lungs: clear to auscultation bilaterally Breasts: normal appearance, no masses or tenderness, No nipple retraction or dimpling, No nipple discharge or bleeding, No axillary adenopathy Heart: regular rate and rhythm Abdomen: soft, non-tender; no masses, no organomegaly Extremities: extremities normal, atraumatic, no cyanosis or edema Skin: skin color, texture, turgor normal. No rashes or  lesions Lymph nodes: cervical, supraclavicular, and axillary nodes normal. Neurologic: grossly normal  Pelvic: External genitalia:  no lesions              No abnormal inguinal nodes palpated.              Urethra:  normal appearing urethra with no masses, tenderness or lesions              Bartholins and Skenes: normal                 Vagina: normal appearing vagina with normal color and discharge, no lesions              Cervix: no lesions              Pap taken: Yes.   Bimanual Exam:  Uterus:  normal size, contour, position, consistency, mobility, non-tender              Adnexa: no mass, fullness, tenderness              Rectal exam: Yes.  .  Confirms.              Anus:  normal sphincter tone, no lesions  Chaperone was present for exam.  Assessment:   Well woman visit with normal exam. Vaginal atrophy.  Vaginitis.  Hx LGSIL pap.  Osteoporosis.  HTN.   Plan: Mammogram screening discussed. Self breast awareness reviewed. Pap and HR HPV as above. Guidelines for Calcium, Vitamin D, regular exercise program including cardiovascular and weight bearing exercise. She will get a blood pressure cuff and measure it at home. She will call her PCP if it remains elevated.  BMD in 2022.  Switch to vaginal estradiol cream.  I discussed potential effect on breast cancer.  Follow up annually and prn.   After visit summary provided.

## 2020-07-22 ENCOUNTER — Ambulatory Visit (INDEPENDENT_AMBULATORY_CARE_PROVIDER_SITE_OTHER): Payer: Medicare Other | Admitting: Obstetrics and Gynecology

## 2020-07-22 ENCOUNTER — Other Ambulatory Visit: Payer: Self-pay

## 2020-07-22 ENCOUNTER — Other Ambulatory Visit (HOSPITAL_COMMUNITY)
Admission: RE | Admit: 2020-07-22 | Discharge: 2020-07-22 | Disposition: A | Discharge status: Home / Self Care | Payer: Medicare Other | Source: Ambulatory Visit | Attending: Obstetrics and Gynecology | Admitting: Obstetrics and Gynecology

## 2020-07-22 ENCOUNTER — Encounter: Payer: Self-pay | Admitting: Obstetrics and Gynecology

## 2020-07-22 VITALS — BP 178/80 | HR 90 | Resp 16 | Ht 63.0 in | Wt 225.0 lb

## 2020-07-22 DIAGNOSIS — Z01419 Encounter for gynecological examination (general) (routine) without abnormal findings: Secondary | ICD-10-CM

## 2020-07-22 DIAGNOSIS — Z8742 Personal history of other diseases of the female genital tract: Secondary | ICD-10-CM | POA: Insufficient documentation

## 2020-07-22 DIAGNOSIS — N760 Acute vaginitis: Secondary | ICD-10-CM

## 2020-07-22 MED ORDER — ESTRADIOL 0.1 MG/GM VA CREA: TOPICAL_CREAM | VAGINAL | 1 refills | Status: DC

## 2020-07-22 NOTE — Patient Instructions (Signed)

## 2020-07-23 ENCOUNTER — Other Ambulatory Visit: Payer: Self-pay | Admitting: *Deleted

## 2020-07-23 LAB — CYTOLOGY - PAP: Diagnosis: NEGATIVE

## 2020-07-23 LAB — VAGINITIS/VAGINOSIS, DNA PROBE
Candida Species: NEGATIVE
Gardnerella vaginalis: POSITIVE — AB
Trichomonas vaginosis: NEGATIVE

## 2020-07-23 MED ORDER — METRONIDAZOLE 500 MG PO TABS: 500.0000 mg | ORAL_TABLET | Freq: Two times a day (BID) | ORAL | 0 refills | Status: DC

## 2021-02-14 ENCOUNTER — Other Ambulatory Visit: Payer: Self-pay | Admitting: Internal Medicine

## 2021-02-14 DIAGNOSIS — Z1231 Encounter for screening mammogram for malignant neoplasm of breast: Secondary | ICD-10-CM

## 2021-04-12 ENCOUNTER — Other Ambulatory Visit: Payer: Self-pay

## 2021-04-12 ENCOUNTER — Ambulatory Visit
Admission: RE | Admit: 2021-04-12 | Discharge: 2021-04-12 | Disposition: A | Discharge status: Home / Self Care | Payer: Medicare Other | Source: Ambulatory Visit | Attending: Internal Medicine | Admitting: Internal Medicine

## 2021-04-12 DIAGNOSIS — Z1231 Encounter for screening mammogram for malignant neoplasm of breast: Secondary | ICD-10-CM

## 2021-04-14 ENCOUNTER — Other Ambulatory Visit: Payer: Self-pay | Admitting: Internal Medicine

## 2021-04-14 DIAGNOSIS — R928 Other abnormal and inconclusive findings on diagnostic imaging of breast: Secondary | ICD-10-CM

## 2021-05-04 ENCOUNTER — Other Ambulatory Visit: Payer: Self-pay | Admitting: Obstetrics and Gynecology

## 2021-05-04 DIAGNOSIS — R928 Other abnormal and inconclusive findings on diagnostic imaging of breast: Secondary | ICD-10-CM

## 2021-05-06 ENCOUNTER — Ambulatory Visit
Admission: RE | Admit: 2021-05-06 | Discharge: 2021-05-06 | Disposition: A | Discharge status: Home / Self Care | Payer: Medicare Other | Source: Ambulatory Visit | Attending: Internal Medicine | Admitting: Internal Medicine

## 2021-05-06 ENCOUNTER — Other Ambulatory Visit: Payer: Self-pay

## 2021-05-06 DIAGNOSIS — R928 Other abnormal and inconclusive findings on diagnostic imaging of breast: Secondary | ICD-10-CM

## 2021-07-07 DIAGNOSIS — S92309A Fracture of unspecified metatarsal bone(s), unspecified foot, initial encounter for closed fracture: Secondary | ICD-10-CM

## 2021-07-07 HISTORY — DX: Fracture of unspecified metatarsal bone(s), unspecified foot, initial encounter for closed fracture: S92.309A

## 2021-07-26 NOTE — Progress Notes (Signed)
GYNECOLOGY  VISIT   HPI: 70 y.o.   Divorced  Caucasian  female   G2P2002 with Patient's last menstrual period was 02/10/1997 (approximate).   here for breast and pelvic exam.   She is also followed for osteopenia, vaginal atrophy and hx of LGSIL.   Difficulty inserting the tube with vaginal estrogen cream in it.  No dryness.  Tried using the Vagifem in the past and wants to use this again.   Fractured her toe.  Went to [facility].  She was told to have a scan to check the blood flow in her legs.   Expecting a new grandbaby.   Declines STD screening.   Labs with PCP.  GYNECOLOGIC HISTORY: Patient's last menstrual period was 02/10/1997 (approximate). Contraception:  Tubal Menopausal hormone therapy:  Estrogen cream Last mammogram: 04-13-21 3D/Lt.Br.poss.mass;Rt.Br.neg.  Diag.Lt.w/US revealed benign left  Last pap smear: 07-22-20 Neg, 07-31-19 Neg, 04-15-18 LGSIL:Neg HR HPV(neg 16,18), 02-25-16 Neg:Neg HR HPV  SCREENING: BMD:  10-09-19 Result :Osteopenia now--improved from Osteoporosis Colonoscopy: 10/2019 normal;5 yrs--mom with colon ca     OB History     Gravida  2   Para  2   Term  2   Preterm  0   AB  0   Living  2      SAB  0   IAB  0   Ectopic  0   Multiple  0   Live Births  2              Patient Active Problem List   Diagnosis Date Noted   Osteopenia of hip 10/12/2017   Numbness and tingling in both hands 06/08/2016   Insomnia 06/08/2016   Classic migraine with aura 06/08/2016   Bilateral carpal tunnel syndrome 06/08/2016   Mixed hyperlipidemia 05/26/2016   Gastroesophageal reflux disease without esophagitis 05/26/2016   GAD (generalized anxiety disorder) 05/26/2016   Left knee pain 05/19/2016   Acute meniscal tear of left knee 05/19/2016   FH: pancreatic cancer 12/05/2013   FH: colon cancer 12/05/2013   Situational depression 12/05/2013   Postmenopausal atrophic vaginitis 12/05/2013   HTN (hypertension) 12/05/2013   Unspecified  vitamin D deficiency 12/05/2013    Past Medical History:  Diagnosis Date   Fractured metatarsal bone 07/07/2021   Hyperlipidemia    Hypertension    Tubular adenoma of colon 08/2002, 12 2006   Vitamin D deficiency     Past Surgical History:  Procedure Laterality Date   BREAST BIOPSY     leftr breast benign more than 20 yrs ago    broken ankle repair Left 11/2018   CESAREAN SECTION     x2   TUBAL LIGATION Bilateral     Current Outpatient Medications  Medication Sig Dispense Refill   aspirin EC 81 MG tablet Take 81 mg by mouth daily.     Calcium Carbonate-Vit D-Min (CALCIUM 1200 PO) Take 1 tablet by mouth daily.     clobetasol (TEMOVATE) 0.05 % external solution 1 application to affected area     desipramine (NORPRAMIN) 50 MG tablet Take 50 mg by mouth daily.     estradiol (ESTRACE) 0.1 MG/GM vaginal cream Use 1/2 g vaginally every night for the first 2 weeks, then use 1/2 g vaginally two or three times per week. 42.5 g 1   ezetimibe (ZETIA) 10 MG tablet Take 10 mg by mouth daily.     FA-B6-B12-D-Omega 3-Phytoster (ANIMI-3) 1 MG CAPS Animi-3 With Vitamin D 500 mg-1,000 unit-500 mcg capsule  hydrochlorothiazide (HYDRODIURIL) 25 MG tablet Take 25 mg by mouth daily.     losartan (COZAAR) 100 MG tablet Take 100 mg by mouth daily.     mometasone (ELOCON) 0.1 % cream Apply 1 application topically as needed.     Multiple Vitamin (MULTIVITAMIN) tablet Take 1 tablet by mouth daily.     omeprazole (PRILOSEC) 20 MG capsule Take by mouth.     No current facility-administered medications for this visit.     ALLERGIES: Atorvastatin  Family History  Problem Relation Age of Onset   Heart disease Father    Colon cancer Mother 4   Pancreatic cancer Brother 47   Leukemia Maternal Grandfather    Breast cancer Cousin    Brain cancer Cousin     Social History   Socioeconomic History   Marital status: Divorced    Spouse name: Not on file   Number of children: Not on file   Years  of education: Not on file   Highest education level: Not on file  Occupational History   Not on file  Tobacco Use   Smoking status: Former    Packs/day: 0.50    Types: Cigarettes   Smokeless tobacco: Never  Vaping Use   Vaping Use: Never used  Substance and Sexual Activity   Alcohol use: Yes    Alcohol/week: 1.0 standard drink    Types: 1 Glasses of wine per week   Drug use: No   Sexual activity: Not Currently    Partners: Male    Birth control/protection: Surgical    Comment: BTL  Other Topics Concern   Not on file  Social History Narrative   Not on file   Social Determinants of Health   Financial Resource Strain: Not on file  Food Insecurity: Not on file  Transportation Needs: Not on file  Physical Activity: Not on file  Stress: Not on file  Social Connections: Not on file  Intimate Partner Violence: Not on file    Review of Systems  All other systems reviewed and are negative.  PHYSICAL EXAMINATION:    BP (!) 160/74   Pulse 90   Ht 5' 2.5" (1.588 m)   Wt 236 lb (107 kg)   LMP 02/10/1997 (Approximate)   SpO2 100%   BMI 42.48 kg/m     General appearance: alert, cooperative and appears stated age Head: Normocephalic, without obvious abnormality, atraumatic Neck: no adenopathy, supple, symmetrical, trachea midline and thyroid normal to inspection and palpation Lungs: clear to auscultation bilaterally Breasts: normal appearance, no masses or tenderness, No nipple retraction or dimpling, No nipple discharge or bleeding, No axillary or supraclavicular adenopathy Heart: regular rate and rhythm Abdomen: soft, non-tender, no masses,  no organomegaly Extremities: extremities normal, atraumatic, no cyanosis or edema Skin: Skin color, texture, turgor normal. No rashes or lesions Lymph nodes: Cervical, supraclavicular, and axillary nodes normal. No abnormal inguinal nodes palpated Neurologic: Grossly normal  Pelvic: External genitalia:  no lesions               Urethra:  normal appearing urethra with no masses, tenderness or lesions              Bartholins and Skenes: normal                 Vagina: normal appearing vagina with normal color and discharge, no lesions              Cervix: no lesions.  No pap collected.  Bimanual Exam:  Uterus:  normal size, contour, position, consistency, mobility, non-tender              Adnexa: no mass, fullness, tenderness              Rectal exam: yes.  Confirms.              Anus:  normal sphincter tone, no lesions  Chaperone was present for exam:  Estill Bamberg, CMA  ASSESSMENT  Well woman visit with GYN exam. Menopausal female. Vaginal atrophy.  Vaginitis.  Hx LGSIL pap and negative HR HPV followed by normal paps.  Osteoporosis.  HTN.   FH colon cancer.   PLAN  Pap and HR HPV testing next year.  Yearly mammogram.  SBE encouraged. Switch from vaginal estrogen to Vagifem.   Calcium 1200 mg daily and vit D 600 - 800 IU daily recommended.  BMD December, 2022. Labs with PCP. Colonoscopy 2025.  New bivalent Covid booster discussed.   An After Visit Summary was printed and given to the patient.  29 min  total time was spent for this patient encounter, including preparation, face-to-face counseling with the patient, coordination of care, and documentation of the encounter.

## 2021-07-27 ENCOUNTER — Ambulatory Visit (INDEPENDENT_AMBULATORY_CARE_PROVIDER_SITE_OTHER): Payer: Medicare Other | Admitting: Obstetrics and Gynecology

## 2021-07-27 ENCOUNTER — Ambulatory Visit: Payer: Medicare Other | Admitting: Obstetrics and Gynecology

## 2021-07-27 ENCOUNTER — Other Ambulatory Visit: Payer: Self-pay

## 2021-07-27 ENCOUNTER — Encounter: Payer: Self-pay | Admitting: Obstetrics and Gynecology

## 2021-07-27 VITALS — BP 160/74 | HR 90 | Ht 62.5 in | Wt 236.0 lb

## 2021-07-27 DIAGNOSIS — Z01419 Encounter for gynecological examination (general) (routine) without abnormal findings: Secondary | ICD-10-CM

## 2021-07-27 DIAGNOSIS — Z780 Asymptomatic menopausal state: Secondary | ICD-10-CM | POA: Diagnosis not present

## 2021-07-27 MED ORDER — ESTRADIOL 10 MCG VA TABS: 1.0000 | ORAL_TABLET | VAGINAL | 3 refills | Status: DC

## 2021-07-27 NOTE — Patient Instructions (Signed)

## 2021-12-19 ENCOUNTER — Ambulatory Visit
Admission: RE | Admit: 2021-12-19 | Discharge: 2021-12-19 | Disposition: A | Discharge status: Home / Self Care | Payer: Medicare Other | Source: Ambulatory Visit | Attending: Obstetrics and Gynecology | Admitting: Obstetrics and Gynecology

## 2021-12-19 DIAGNOSIS — Z780 Asymptomatic menopausal state: Secondary | ICD-10-CM

## 2022-04-06 ENCOUNTER — Other Ambulatory Visit: Payer: Self-pay | Admitting: Internal Medicine

## 2022-04-06 DIAGNOSIS — Z1231 Encounter for screening mammogram for malignant neoplasm of breast: Secondary | ICD-10-CM

## 2022-04-25 ENCOUNTER — Ambulatory Visit
Admission: RE | Admit: 2022-04-25 | Discharge: 2022-04-25 | Disposition: A | Discharge status: Home / Self Care | Payer: Medicare Other | Source: Ambulatory Visit | Attending: Internal Medicine | Admitting: Internal Medicine

## 2022-04-25 DIAGNOSIS — Z1231 Encounter for screening mammogram for malignant neoplasm of breast: Secondary | ICD-10-CM

## 2022-04-27 ENCOUNTER — Other Ambulatory Visit: Payer: Self-pay | Admitting: Internal Medicine

## 2022-04-27 DIAGNOSIS — E079 Disorder of thyroid, unspecified: Secondary | ICD-10-CM

## 2022-04-27 DIAGNOSIS — R928 Other abnormal and inconclusive findings on diagnostic imaging of breast: Secondary | ICD-10-CM

## 2022-05-05 ENCOUNTER — Other Ambulatory Visit: Payer: Medicare Other

## 2022-05-05 ENCOUNTER — Ambulatory Visit
Admission: RE | Admit: 2022-05-05 | Discharge: 2022-05-05 | Disposition: A | Discharge status: Home / Self Care | Payer: Medicare Other | Source: Ambulatory Visit | Attending: Internal Medicine | Admitting: Internal Medicine

## 2022-05-05 DIAGNOSIS — R928 Other abnormal and inconclusive findings on diagnostic imaging of breast: Secondary | ICD-10-CM

## 2022-05-25 ENCOUNTER — Other Ambulatory Visit: Payer: Self-pay | Admitting: Internal Medicine

## 2022-05-25 DIAGNOSIS — R922 Inconclusive mammogram: Secondary | ICD-10-CM

## 2022-06-04 ENCOUNTER — Ambulatory Visit
Admission: RE | Admit: 2022-06-04 | Discharge: 2022-06-04 | Disposition: A | Discharge status: Home / Self Care | Payer: Medicare Other | Source: Ambulatory Visit | Attending: Internal Medicine | Admitting: Internal Medicine

## 2022-06-04 DIAGNOSIS — R922 Inconclusive mammogram: Secondary | ICD-10-CM

## 2022-06-04 MED ORDER — GADOBUTROL 1 MMOL/ML IV SOLN
10.0000 mL | Freq: Once | INTRAVENOUS | Status: AC | PRN
Start: 2022-06-04 — End: 2022-06-04
  Administered 2022-06-04: 10 mL via INTRAVENOUS

## 2022-06-05 ENCOUNTER — Other Ambulatory Visit: Payer: Self-pay | Admitting: Internal Medicine

## 2022-06-05 DIAGNOSIS — N6489 Other specified disorders of breast: Secondary | ICD-10-CM

## 2022-07-31 NOTE — Progress Notes (Unsigned)
71 y.o. G39P2002 [name] here for annual breast and pelvic exam.    She is also followed for osteopenia, vaginal atrophy and hx of LGSIL.   PCP:  Adline Mango, MD   Patient's last menstrual period was 02/10/1997 (approximate).           Sexually active: {yes no:314532}  The current method of family planning is tubal ligation.    Exercising: {yes RF:163846}  {types:19826} Smoker:  ***Former  Health Maintenance: Pap:   07-22-20 Neg, 07-31-19 Neg, 04-15-18 LGSIL:Neg HR HPV(neg 16,18), 02-25-16 Neg:Neg HR HPV History of abnormal Pap:  {YES NO:22349} MMG:  06-04-22 MR Br.Bil/Neg/BiRads1/Rec.Lt.Br.Diag.3 mo/SEE Epic//ald  Colonoscopy:  ***10/2019 normal;5 yrs--mom with colon cancer BMD:  12-18-21  Result :Osteopenia TDaP:  02-25-16 Gardasil:   no HIV: 02-25-16 NR Hep C: 02-25-16 Screening Labs:  Hb today: ***, Urine today: ***   reports that she has quit smoking. Her smoking use included cigarettes. She smoked an average of .5 packs per day. She has never used smokeless tobacco. She reports current alcohol use of about 1.0 standard drink of alcohol per week. She reports that she does not use drugs.  Past Medical History:  Diagnosis Date   Fractured metatarsal bone 07/07/2021   Hyperlipidemia    Hypertension    Tubular adenoma of colon 08/2002, 12 2006   Vitamin D deficiency     Past Surgical History:  Procedure Laterality Date   BREAST BIOPSY     leftr breast benign more than 20 yrs ago    broken ankle repair Left 11/2018   CESAREAN SECTION     x2   TUBAL LIGATION Bilateral     Current Outpatient Medications  Medication Sig Dispense Refill   aspirin EC 81 MG tablet Take 81 mg by mouth daily.     Calcium Carbonate-Vit D-Min (CALCIUM 1200 PO) Take 1 tablet by mouth daily.     clobetasol (TEMOVATE) 0.05 % external solution 1 application to affected area     desipramine (NORPRAMIN) 50 MG tablet Take 50 mg by mouth daily.     Estradiol 10 MCG TABS vaginal tablet Place 1  tablet (10 mcg total) vaginally 2 (two) times a week. 24 tablet 3   ezetimibe (ZETIA) 10 MG tablet Take 10 mg by mouth daily.     FA-B6-B12-D-Omega 3-Phytoster (ANIMI-3) 1 MG CAPS Animi-3 With Vitamin D 500 mg-1,000 unit-500 mcg capsule     hydrochlorothiazide (HYDRODIURIL) 25 MG tablet Take 25 mg by mouth daily.     losartan (COZAAR) 100 MG tablet Take 100 mg by mouth daily.     mometasone (ELOCON) 0.1 % cream Apply 1 application topically as needed.     Multiple Vitamin (MULTIVITAMIN) tablet Take 1 tablet by mouth daily.     omeprazole (PRILOSEC) 20 MG capsule Take by mouth.     No current facility-administered medications for this visit.    Family History  Problem Relation Age of Onset   Heart disease Father    Colon cancer Mother 56   Pancreatic cancer Brother 55   Leukemia Maternal Grandfather    Breast cancer Cousin    Brain cancer Cousin     Review of Systems  Exam:   LMP 02/10/1997 (Approximate)     General appearance: alert, cooperative and appears stated age Head: normocephalic, without obvious abnormality, atraumatic Neck: no adenopathy, supple, symmetrical, trachea midline and thyroid normal to inspection and palpation Lungs: clear to auscultation bilaterally Breasts: normal appearance, no masses or tenderness, No nipple  retraction or dimpling, No nipple discharge or bleeding, No axillary adenopathy Heart: regular rate and rhythm Abdomen: soft, non-tender; no masses, no organomegaly Extremities: extremities normal, atraumatic, no cyanosis or edema Skin: skin color, texture, turgor normal. No rashes or lesions Lymph nodes: cervical, supraclavicular, and axillary nodes normal. Neurologic: grossly normal  Pelvic: External genitalia:  no lesions              No abnormal inguinal nodes palpated.              Urethra:  normal appearing urethra with no masses, tenderness or lesions              Bartholins and Skenes: normal                 Vagina: normal appearing  vagina with normal color and discharge, no lesions              Cervix: no lesions              Pap taken: {yes no:314532} Bimanual Exam:  Uterus:  normal size, contour, position, consistency, mobility, non-tender              Adnexa: no mass, fullness, tenderness              Rectal exam: {yes no:314532}.  Confirms.              Anus:  normal sphincter tone, no lesions  Chaperone was present for exam:  ***  Assessment:   Well woman visit with gynecologic exam.   Plan: Mammogram screening discussed. Self breast awareness reviewed. Pap and HR HPV as above. Guidelines for Calcium, Vitamin D, regular exercise program including cardiovascular and weight bearing exercise.   Follow up annually and prn.   Additional counseling given.  {yes [id number]. _______ minutes face to face time of which over 50% was spent in counseling.    After visit summary provided.

## 2022-08-02 ENCOUNTER — Encounter: Payer: Self-pay | Admitting: Obstetrics and Gynecology

## 2022-08-02 ENCOUNTER — Ambulatory Visit (INDEPENDENT_AMBULATORY_CARE_PROVIDER_SITE_OTHER): Payer: Medicare Other | Admitting: Obstetrics and Gynecology

## 2022-08-02 ENCOUNTER — Other Ambulatory Visit (HOSPITAL_COMMUNITY)
Admission: RE | Admit: 2022-08-02 | Discharge: 2022-08-02 | Disposition: A | Discharge status: Home / Self Care | Payer: Medicare Other | Source: Ambulatory Visit | Attending: Obstetrics and Gynecology | Admitting: Obstetrics and Gynecology

## 2022-08-02 VITALS — BP 128/70 | HR 92 | Ht 63.0 in | Wt 244.0 lb

## 2022-08-02 DIAGNOSIS — N760 Acute vaginitis: Secondary | ICD-10-CM

## 2022-08-02 DIAGNOSIS — Z8742 Personal history of other diseases of the female genital tract: Secondary | ICD-10-CM

## 2022-08-02 DIAGNOSIS — Z9189 Other specified personal risk factors, not elsewhere classified: Secondary | ICD-10-CM | POA: Diagnosis not present

## 2022-08-02 DIAGNOSIS — N9089 Other specified noninflammatory disorders of vulva and perineum: Secondary | ICD-10-CM

## 2022-08-02 DIAGNOSIS — Z01419 Encounter for gynecological examination (general) (routine) without abnormal findings: Secondary | ICD-10-CM

## 2022-08-02 DIAGNOSIS — Z124 Encounter for screening for malignant neoplasm of cervix: Secondary | ICD-10-CM | POA: Insufficient documentation

## 2022-08-02 DIAGNOSIS — M8580 Other specified disorders of bone density and structure, unspecified site: Secondary | ICD-10-CM | POA: Diagnosis not present

## 2022-08-02 MED ORDER — ESTRADIOL 10 MCG VA TABS: 1.0000 | ORAL_TABLET | VAGINAL | 3 refills | Status: DC

## 2022-08-02 NOTE — Patient Instructions (Signed)
EXERCISE AND DIET:  We recommended that you start or continue a regular exercise program for good health. Regular exercise means any activity that makes your heart beat faster and makes you sweat.  We recommend exercising at least 30 minutes per day at least 3 days a week, preferably 4 or 5.  We also recommend a diet low in fat and sugar.  Inactivity, poor dietary choices and obesity can cause diabetes, heart attack, stroke, and kidney damage, among others.    ALCOHOL AND SMOKING:  Women should limit their alcohol intake to no more than 7 drinks/beers/glasses of wine (combined, not each!) per week. Moderation of alcohol intake to this level decreases your risk of breast cancer and liver damage. And of course, no recreational drugs are part of a healthy lifestyle.  And absolutely no smoking or even second hand smoke. Most people know smoking can cause heart and lung diseases, but did you know it also contributes to weakening of your bones? Aging of your skin?  Yellowing of your teeth and nails?  CALCIUM AND VITAMIN D:  Adequate intake of calcium and Vitamin D are recommended.  The recommendations for exact amounts of these supplements seem to change often, but generally speaking 600 mg of calcium (either carbonate or citrate) and 800 units of Vitamin D per day seems prudent. Certain women may benefit from higher intake of Vitamin D.  If you are among these women, your doctor will have told you during your visit.    PAP SMEARS:  Pap smears, to check for cervical cancer or precancers,  have traditionally been done yearly, although recent scientific advances have shown that most women can have pap smears less often.  However, every woman still should have a physical exam from her gynecologist every year. It will include a breast check, inspection of the vulva and vagina to check for abnormal growths or skin changes, a visual exam of the cervix, and then an exam to evaluate the size and shape of the uterus and  ovaries.  And after 70 years of age, a rectal exam is indicated to check for rectal cancers. We will also provide age appropriate advice regarding health maintenance, like when you should have certain vaccines, screening for sexually transmitted diseases, bone density testing, colonoscopy, mammograms, etc.   MAMMOGRAMS:  All women over 40 years old should have a yearly mammogram. Many facilities now offer a "3D" mammogram, which may cost around $50 extra out of pocket. If possible,  we recommend you accept the option to have the 3D mammogram performed.  It both reduces the number of women who will be called back for extra views which then turn out to be normal, and it is better than the routine mammogram at detecting truly abnormal areas.    COLONOSCOPY:  Colonoscopy to screen for colon cancer is recommended for all women at age 50.  We know, you hate the idea of the prep.  We agree, BUT, having colon cancer and not knowing it is worse!!  Colon cancer so often starts as a polyp that can be seen and removed at colonscopy, which can quite literally save your life!  And if your first colonoscopy is normal and you have no family history of colon cancer, most women don't have to have it again for 10 years.  Once every ten years, you can do something that may end up saving your life, right?  We will be happy to help you get it scheduled when you are ready.    Be sure to check your insurance coverage so you understand how much it will cost.  It may be covered as a preventative service at no cost, but you should check your particular policy.    Lichen Sclerosus Lichen sclerosus is a skin problem. It can happen on any part of the body, but it commonly involves the anal and genital areas. It can cause itching and discomfort in these areas. Treatment can help to control symptoms. When the genital area is affected, getting treatment is important because the condition can cause scarring that may lead to other problems if  left untreated. What are the causes? The cause of this condition is not known. It may be related to an overactive immune system or a lack of certain hormones. Lichen sclerosus is not an infection or a fungus, and it is not passed from one person to another (non-contagious). What increases the risk? The following factors may make you more likely to develop this condition: You are a woman who has reached menopause. You are a man who was not circumcised. This condition may also develop for the first time in children, usually before they enter puberty. What are the signs or symptoms? Symptoms of this condition include: White areas (plaques) on the skin that may be thin and wrinkled, or thickened. Red and swollen patches (lesions) on the skin. Tears or cracks in the skin. Bruising. Blood blisters. Severe itching. Pain, itching, or burning when urinating. Constipation is also common in children with lichen sclerosus, but can be seen in adults. How is this diagnosed? This condition may be diagnosed with a physical exam. In some cases, a tissue sample may be removed to be checked under a microscope (biopsy). How is this treated? This condition may be treated with: Topical steroids. These are medicated creams or ointments that are applied over the affected areas. Medicines that are taken by mouth. Topical immunotherapy. These are medicated creams or ointments that are applied over the affected areas. They stimulate your immune system to fight the skin condition. This may be used if steroids are not effective. Surgery. This may be needed in more severe cases that are causing problems such as scarring. Follow these instructions at home: Medicines Take over-the-counter and prescription medicines only as told by your health care provider. Use creams or ointments as told by your health care provider. Skin care Do not scratch the affected areas of skin. If you are a woman, be sure to keep the  vaginal area as clean and dry as possible. Clean the affected area of skin gently with water only. Pat skin dry and avoid the use of rough towels or toilet paper. Avoid irritating skin products, including soap and scented lotions. Use emollient creams as directed by your health care provider to help reduce itching. General instructions Keep all follow-up visits. This is important. Your condition may cause constipation. To prevent or treat constipation, you may need to: Drink enough fluid to keep your urine pale yellow. Take over-the-counter or prescription medicines. Eat foods that are high in fiber, such as beans, whole grains, and fresh fruits and vegetables. Limit foods that are high in fat and processed sugars, such as fried or sweet foods. Contact a health care provider if: You have increasing redness, swelling, or pain in the affected area. You have fluid, blood, or pus coming from the affected area. You have new lesions on your skin. You have a fever. You have pain during sex. Get help right away if: You develop  severe pain or burning in the affected areas, especially in the genital area. Summary Lichen sclerosus is a skin problem. When the genital area is affected, getting treatment is important because the condition can cause scarring that may lead to other problems if left untreated. This condition is usually treated with medicated creams or ointments (topical steroids) that are applied over the affected areas. Take or use over-the-counter and prescription medicines only as told by your health care provider. Contact a health care provider if you have new lesions on your skin, have pain during sex, or have increasing redness, swelling, or pain in the affected area. Keep all follow-up visits. This is important. This information is not intended to replace advice given to you by your health care provider. Make sure you discuss any questions you have with your health care  provider. Document Revised: 03/06/2020 Document Reviewed: 03/06/2020 Elsevier Patient Education  [place].

## 2022-08-03 LAB — CERVICOVAGINAL ANCILLARY ONLY
Bacterial Vaginitis (gardnerella): NEGATIVE
Candida Glabrata: NEGATIVE
Candida Vaginitis: NEGATIVE
Comment: NEGATIVE
Comment: NEGATIVE
Comment: NEGATIVE
Comment: NEGATIVE
Trichomonas: NEGATIVE

## 2022-08-11 ENCOUNTER — Ambulatory Visit
Admission: RE | Admit: 2022-08-11 | Discharge: 2022-08-11 | Disposition: A | Discharge status: Home / Self Care | Payer: Medicare Other | Source: Ambulatory Visit | Attending: Internal Medicine | Admitting: Internal Medicine

## 2022-08-11 ENCOUNTER — Other Ambulatory Visit: Payer: Self-pay | Admitting: Internal Medicine

## 2022-08-11 DIAGNOSIS — N6489 Other specified disorders of breast: Secondary | ICD-10-CM

## 2022-08-24 ENCOUNTER — Ambulatory Visit: Payer: Medicare Other | Admitting: Obstetrics and Gynecology

## 2022-08-24 ENCOUNTER — Encounter: Payer: Self-pay | Admitting: Obstetrics and Gynecology

## 2022-08-24 ENCOUNTER — Other Ambulatory Visit (HOSPITAL_COMMUNITY)
Admission: RE | Admit: 2022-08-24 | Discharge: 2022-08-24 | Disposition: A | Discharge status: Home / Self Care | Payer: Medicare Other | Source: Ambulatory Visit | Attending: Obstetrics and Gynecology | Admitting: Obstetrics and Gynecology

## 2022-08-24 DIAGNOSIS — N9089 Other specified noninflammatory disorders of vulva and perineum: Secondary | ICD-10-CM | POA: Insufficient documentation

## 2022-08-24 LAB — CYTOLOGY - PAP: Diagnosis: NEGATIVE

## 2022-08-24 NOTE — Progress Notes (Signed)
  Subjective:     Patient ID: Brittany Ward, female   DOB: [birth year], 71 y.o.   MRN: [id number]  HPI  Here for vulvar biopsy biopsy.  She was noted to have white skin color change to superior labia minora where they meet. Also has a slit in the skin.   Hurts to use wipes on her bottom for cleansing after using the bathroom.   She has been using Vagifem more regularly.   Pap from 08/02/22 is still pending.   Review of Systems  All other systems reviewed and are negative.      Objective:   Physical Exam  Vulva with hypopigmentation of the right and left periclitoral region with a slit in the skin in the midline.  Hypopigmentation of the bilateral labia majora and her perineal body.  Vulvar biopsy of left periclitoral region.  Consent done.  Sterile prep with Hibiclens.  Local 1% lidocaine, lot [id number], exp 07/24.  4 mm punch biopsy.  Tissue to pathology.  Single suture of 3/0 Vicryl.  No complications.  Minimal EBL.     Assessment:     Vulvar lesion.  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     Plan:     Follow up biopsy results.  Post biopsy care and precautions given.  We discussed anticipated dx of lichen sclerosus and treatment options with clobetasol or betamethasone ointment.  FU for suture removal in 2 weeks if needed.   [facility] lab has been called to check the status of her pap, which will be reported in Epic.

## 2022-08-24 NOTE — Patient Instructions (Signed)
Vulva Biopsy, Care After The following information offers guidance on how to care for yourself after your procedure. Your health care provider may also give you more specific instructions. If you have problems or questions, contact your health care provider. What can I expect after the procedure? After the procedure, it is common to have: Slight bleeding from the biopsy site. Slight pain or discomfort at the biopsy site. Follow these instructions at home: Biopsy site care  Follow instructions from your health care provider about how to take care of your biopsy site. Make sure you: Clean the area using water and mild soap twice a day or as told by your health care provider. Gently pat the area dry. You may shower 24 hours after the procedure. If you were prescribed an antibiotic ointment, apply it as told by your health care provider. Do not stop using the antibiotic even if your condition improves. If told by your health care provider, take a sitz bath to help with pain and discomfort. This is a warm water bath that you take while sitting down. Do this as often as told by your health care provider. The water should only come up to your hips and cover your buttocks. You may pat the area dry with a soft, clean towel. Leave stitches (sutures), skin glue, or adhesive strips in place. These skin closures may need to stay in place for 2 weeks or longer. If adhesive strip edges start to loosen and curl up, you may trim the loose edges. Do not remove adhesive strips completely unless your health care provider tells you to do that. Check your biopsy site every day for signs of infection. It may be helpful to use a handheld mirror to do this. Check for: Redness, swelling, or more pain. More fluid or blood. Warmth. Pus or a bad smell. Do not rub the biopsy area after urinating. Gently pat the area dry or use a bottle filled with warm water (peri bottle) to clean the area. Gently wipe from front to  back. Lifestyle Wear loose, cotton underwear. Do not wear tight pants. For at least 1 week or until your health care provider approves: Do not use tampons, douche, or put anything inside your vagina. Do not have sex. Until your health care provider approves: Do not exercise, such as running or biking. Do not swim or use a hot tub. General instructions Take over-the-counter and prescription medicines only as told by your health care provider. Drink enough fluid to keep your urine pale yellow. Use a sanitary napkin until the bleeding stops. If told, put ice on the biopsy site. To do this: Place ice in a plastic bag. Place a towel between your skin and the bag. Leave the ice on for 20 minutes, 2-3 times a day. Remove the ice if your skin turns bright red. This is very important. If you cannot feel pain, heat, or cold, you have a greater risk of damage to the area. Keep all follow-up visits. This is important. Contact a health care provider if: You have redness, swelling, or more pain around your biopsy site. You have more fluid or blood coming from your biopsy site. Your biopsy site feels warm to the touch. Your pain is not controlled with medicine or ice packs. You have a fever or chills. Get help right away if: You have heavy bleeding from the vulva. You have pus or a bad smell coming from the biopsy site. You have abdominal pain. Summary After the procedure, it  is common to have slight bleeding and discomfort at the biopsy site. Follow instructions from your health care provider after your biopsy. Take sitz baths as told by your health care provider to help with pain and discomfort. Leave any sutures in place. Contact your health care provider if you notice any signs of infection around the biopsy site, including redness, swelling, more pain, more fluid or blood, or warmth. Keep all follow-up visits. This is important. This information is not intended to replace advice given to you  by your health care provider. Make sure you discuss any questions you have with your health care provider. Document Revised: 07/12/2021 Document Reviewed: 07/12/2021 Elsevier Patient Education  [place].

## 2022-08-28 ENCOUNTER — Other Ambulatory Visit: Payer: Self-pay

## 2022-08-28 LAB — SURGICAL PATHOLOGY

## 2022-08-28 MED ORDER — CLOBETASOL PROPIONATE 0.05 % EX CREA: TOPICAL_CREAM | CUTANEOUS | 0 refills | Status: DC

## 2022-08-28 MED ORDER — CLOBETASOL PROPIONATE 0.05 % EX OINT: TOPICAL_OINTMENT | CUTANEOUS | 0 refills | Status: DC

## 2022-11-15 ENCOUNTER — Ambulatory Visit
Admission: RE | Admit: 2022-11-15 | Discharge: 2022-11-15 | Disposition: A | Discharge status: Home / Self Care | Payer: Medicare Other | Source: Ambulatory Visit | Attending: Internal Medicine | Admitting: Internal Medicine

## 2022-11-15 ENCOUNTER — Other Ambulatory Visit: Payer: Self-pay | Admitting: Internal Medicine

## 2022-11-15 DIAGNOSIS — N6320 Unspecified lump in the left breast, unspecified quadrant: Secondary | ICD-10-CM

## 2022-11-15 DIAGNOSIS — N6489 Other specified disorders of breast: Secondary | ICD-10-CM

## 2022-11-16 ENCOUNTER — Ambulatory Visit
Admission: RE | Admit: 2022-11-16 | Discharge: 2022-11-16 | Disposition: A | Discharge status: Home / Self Care | Payer: Medicare Other | Source: Ambulatory Visit | Attending: Internal Medicine | Admitting: Internal Medicine

## 2022-11-16 ENCOUNTER — Other Ambulatory Visit: Payer: Self-pay | Admitting: Internal Medicine

## 2022-11-16 DIAGNOSIS — N6320 Unspecified lump in the left breast, unspecified quadrant: Secondary | ICD-10-CM

## 2022-11-16 DIAGNOSIS — N6489 Other specified disorders of breast: Secondary | ICD-10-CM

## 2022-11-16 HISTORY — PX: BREAST BIOPSY: SHX20

## 2022-12-22 ENCOUNTER — Other Ambulatory Visit: Payer: Self-pay | Admitting: Internal Medicine

## 2022-12-22 DIAGNOSIS — N6489 Other specified disorders of breast: Secondary | ICD-10-CM

## 2022-12-28 IMAGING — MG MM DIGITAL SCREENING BILAT W/ TOMO AND CAD
8 series · 8 of 24 positions shown · non-contrast
Comparison: Previous exam(s).

CLINICAL DATA: Screening.

EXAM:
DIGITAL SCREENING BILATERAL MAMMOGRAM WITH TOMOSYNTHESIS AND CAD
TECHNIQUE: Bilateral screening digital craniocaudal and mediolateral oblique
mammograms were obtained. Bilateral screening digital breast
tomosynthesis was performed. The images were evaluated with
computer-aided detection.

[L MLO synth-2D]
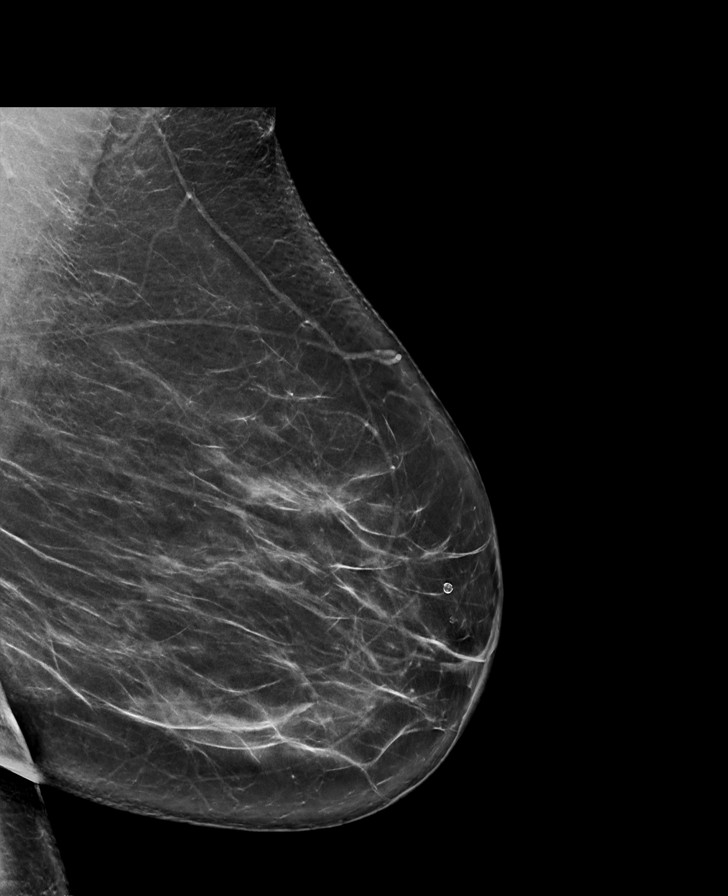

[R MLO synth-2D]
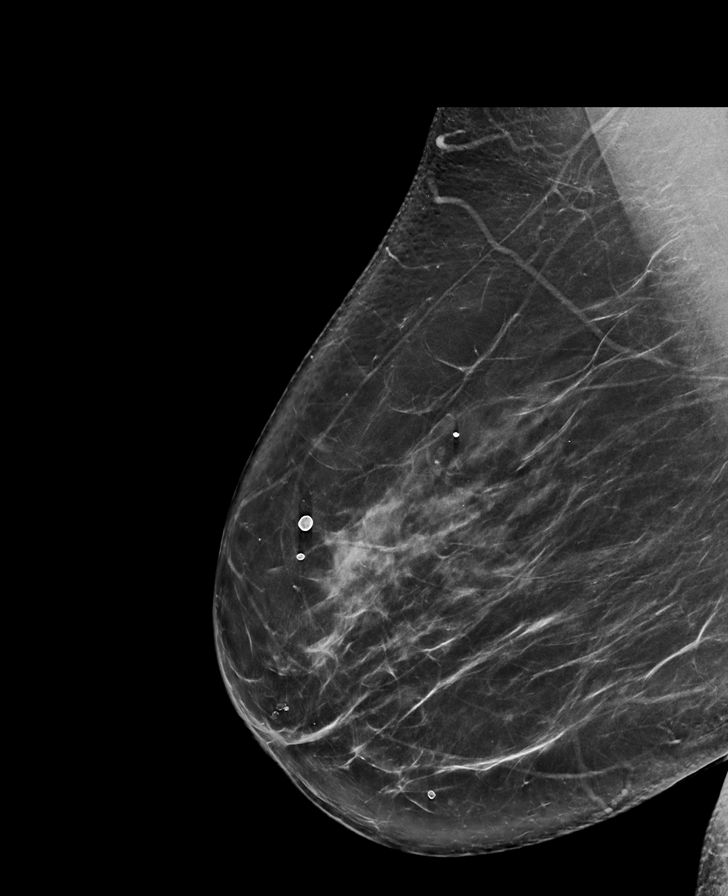

[R CC synth-2D]
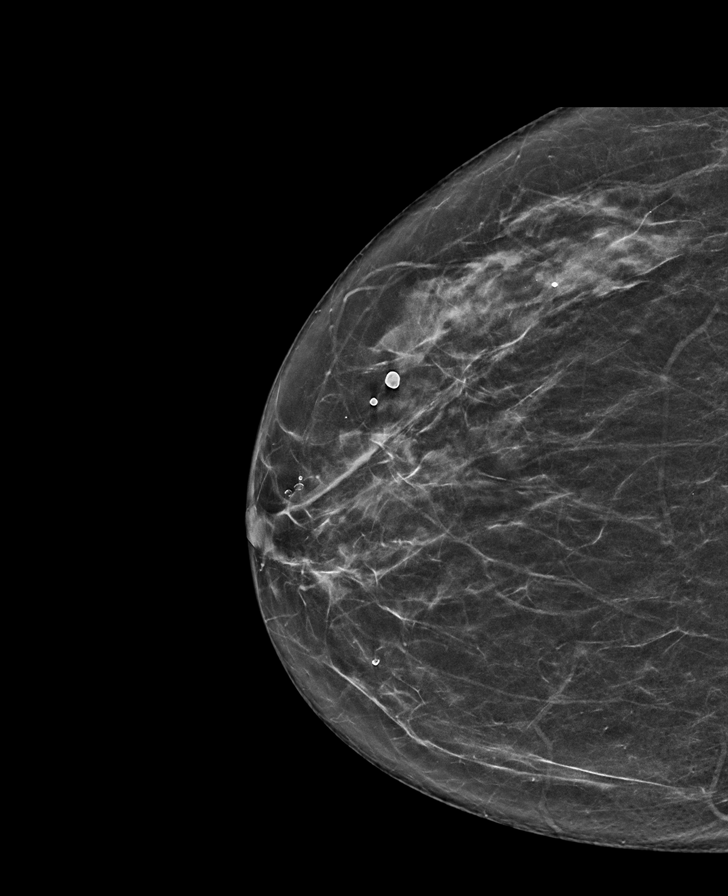

[L CC synth-2D]
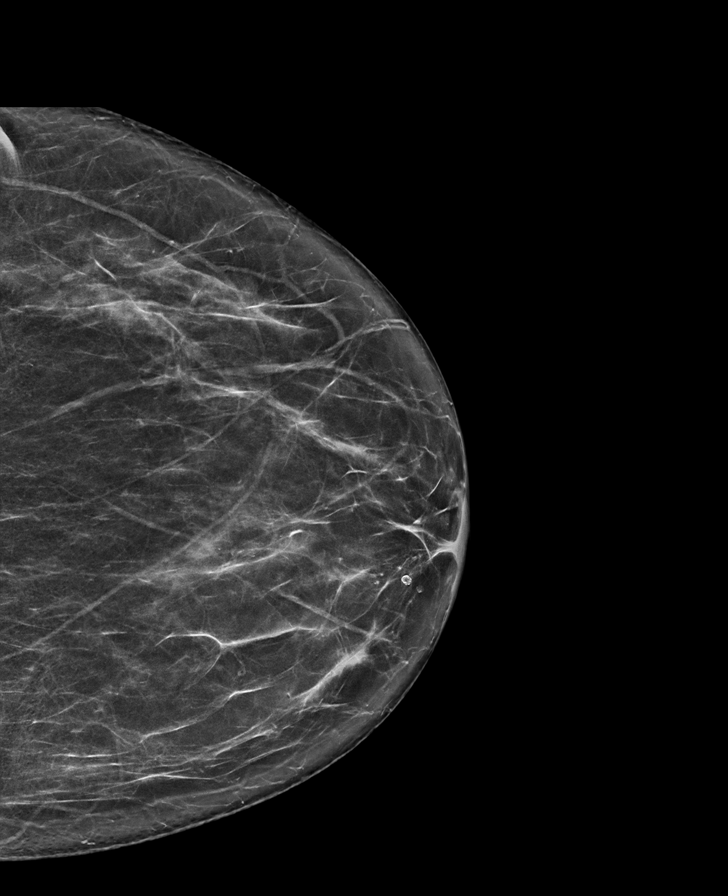

[L MLO tomo · tomo slice 43/86.0]
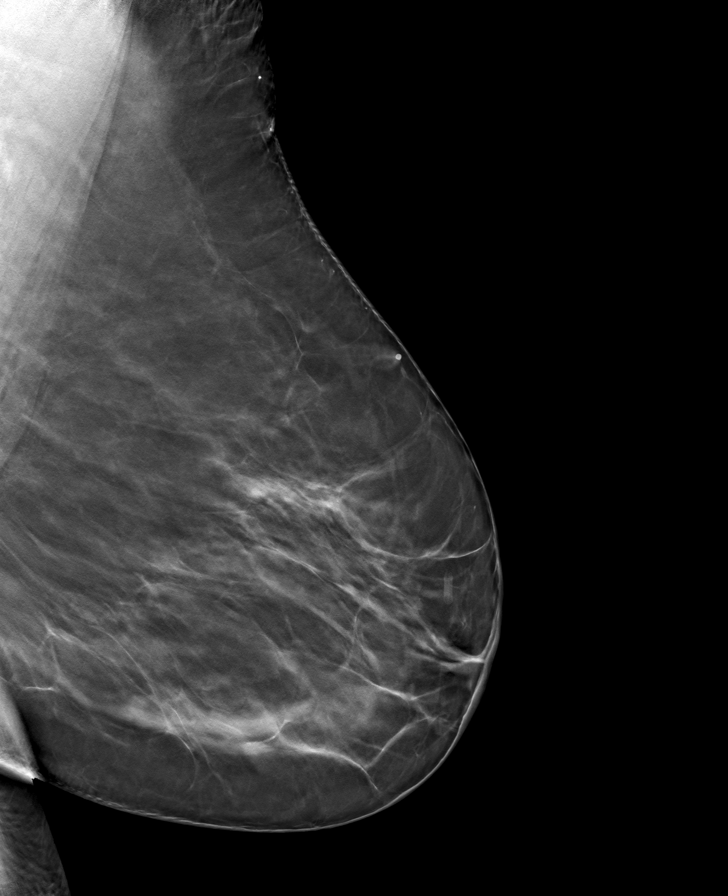

[L CC tomo · tomo slice 38/75.0]
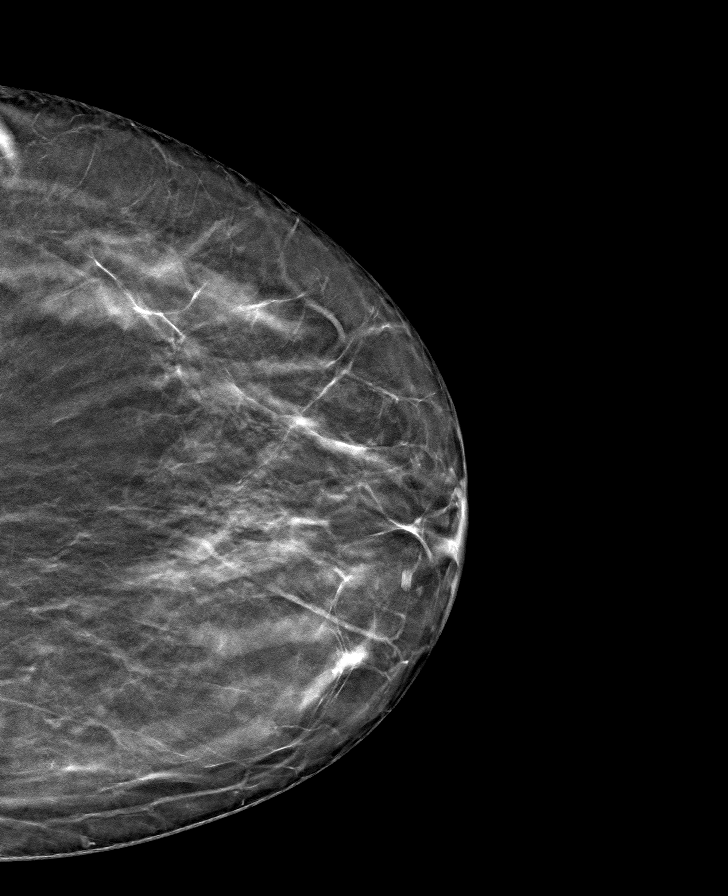

[R MLO tomo · tomo slice 43/85.0]
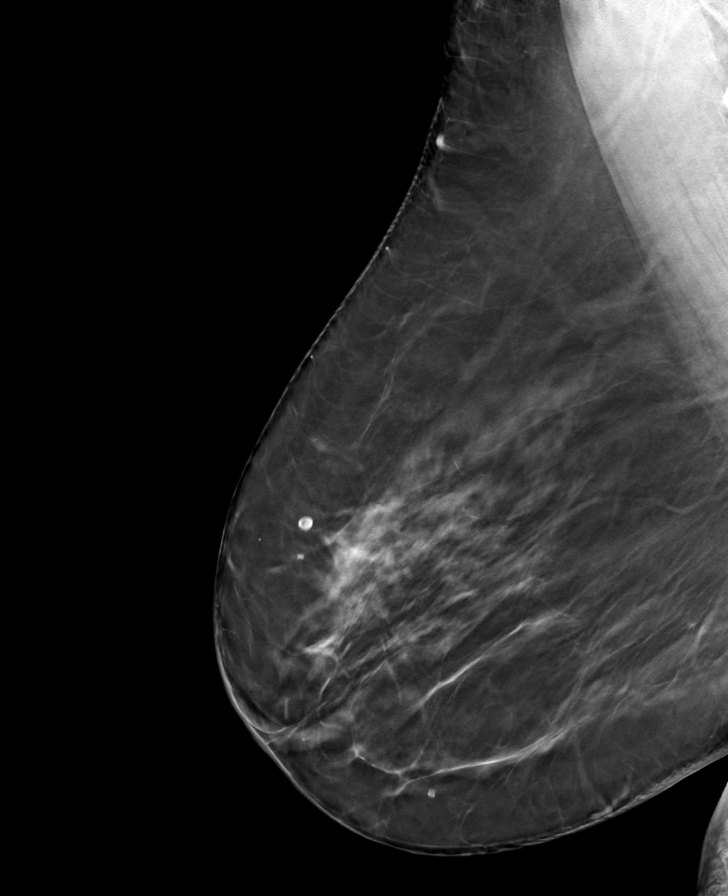

[R CC tomo · tomo slice 35/68.0]
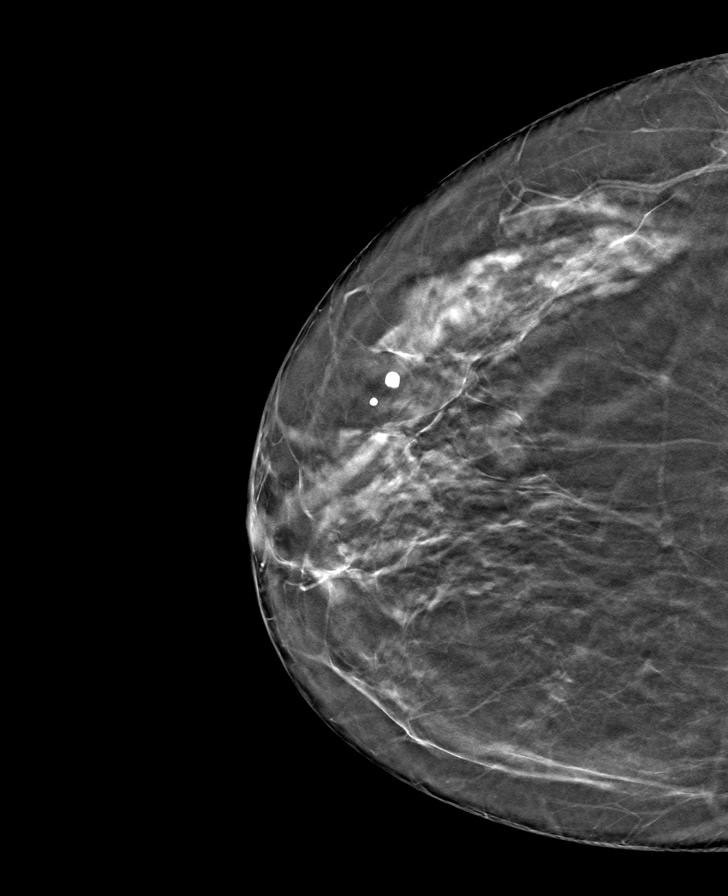

[8 of 24 positions shown; findings below may reference images not displayed]

ACR Breast Density Category b: There are scattered areas of
fibroglandular density.
FINDINGS: In the left breast, a possible developing asymmetry warrants further
evaluation. In the right breast, no findings suspicious for
malignancy.
IMPRESSION: Further evaluation is suggested for a possible developing asymmetry
in the left breast.

RECOMMENDATION:
Diagnostic mammogram and possibly ultrasound of the left breast.
(Code:LP-S-DDF)

The patient will be contacted regarding the findings, and additional
imaging will be scheduled.

BI-RADS CATEGORY  0: Incomplete. Need additional imaging evaluation
and/or prior mammograms for comparison.

## 2023-05-28 ENCOUNTER — Other Ambulatory Visit: Payer: Self-pay | Admitting: Internal Medicine

## 2023-05-28 ENCOUNTER — Ambulatory Visit
Admission: RE | Admit: 2023-05-28 | Discharge: 2023-05-28 | Disposition: A | Discharge status: Home / Self Care | Payer: Medicare Other | Source: Ambulatory Visit | Attending: Internal Medicine | Admitting: Internal Medicine

## 2023-05-28 DIAGNOSIS — N6489 Other specified disorders of breast: Secondary | ICD-10-CM

## 2023-08-09 NOTE — Progress Notes (Deleted)
72 y.o. G32P2002 [name] here for annual exam.    PCP:     Patient's last menstrual period was 02/10/1997 (approximate).           Sexually active: {yes no:314532}  The current method of family planning is post menopausal status.    Exercising: {yes no:314532}  {types:19826} Smoker:  former  Health Maintenance: Pap:  08/02/22 neg, 07/22/20 neg History of abnormal Pap:  no MMG:  05/28/23 Breast Density Cat B, BI-RADS CAT 2 benign Colonoscopy:  10/2019 normal BMD:   12/19/21  Result  osteopenic TDaP:  02/25/16 Gardasil:   no HIV: 421/17 neg Hep C: 02/25/16 neg Screening Labs:  Hb today: ***, Urine today: ***   reports that she has quit smoking. Her smoking use included cigarettes. She has never used smokeless tobacco. She reports current alcohol use of about 1.0 standard drink of alcohol per week. She reports that she does not use drugs.  Past Medical History:  Diagnosis Date   Fractured metatarsal bone 07/07/2021   Hyperlipidemia    Hypertension    Tubular adenoma of colon 08/2002, 12 2006   Vitamin D deficiency     Past Surgical History:  Procedure Laterality Date   BREAST BIOPSY     leftr breast benign more than 20 yrs ago    BREAST BIOPSY Left 11/16/2022   [place] LT BREAST BX W LOC DEV 1ST LESION IMG BX SPEC US GUIDE 11/16/2022 [facility]   broken ankle repair Left 11/2018   CESAREAN SECTION     x2   TUBAL LIGATION Bilateral     Current Outpatient Medications  Medication Sig Dispense Refill   aspirin EC 81 MG tablet Take 81 mg by mouth daily.     calcium carbonate (SUPER CALCIUM) 1500 (600 Ca) MG TABS tablet 1 tablet with meals Orally Twice a day     Cetirizine HCl 10 MG CAPS Take 1 capsule by mouth daily.     Cholecalciferol 125 MCG (5000 UT) TABS Take by mouth.     clobetasol (TEMOVATE) 0.05 % external solution 1 application to affected area Externally--Not for face or folds once daily to scalp for 30     clobetasol ointment (TEMOVATE) 0.05 %  S:Apply a pea sized amount topically qd for up to 12 weeks as needed. 30 g 0   desipramine (NORPRAMIN) 50 MG tablet Take 50 mg by mouth daily.     Estradiol 10 MCG TABS vaginal tablet Place 1 tablet (10 mcg total) vaginally 2 (two) times a week. 24 tablet 3   ezetimibe (ZETIA) 10 MG tablet Take 1 tablet by mouth daily.     hydrochlorothiazide (HYDRODIURIL) 25 MG tablet Take 25 mg by mouth daily.     losartan (COZAAR) 100 MG tablet Take 1 tablet by mouth daily.     mometasone (ELOCON) 0.1 % cream Apply 1 application topically as needed.     Multiple Vitamin (MULTIVITAMIN) tablet Take 1 tablet by mouth daily.     omeprazole (PRILOSEC) 20 MG capsule Take by mouth.     No current facility-administered medications for this visit.    Family History  Problem Relation Age of Onset   Heart disease Father    Colon cancer Mother 84   Pancreatic cancer Brother 46   Leukemia Maternal Grandfather    Breast cancer Cousin    Brain cancer Cousin     Review of Systems  Exam:   LMP 02/10/1997 (Approximate)     General appearance: alert,  cooperative and appears stated age Head: normocephalic, without obvious abnormality, atraumatic Neck: no adenopathy, supple, symmetrical, trachea midline and thyroid normal to inspection and palpation Lungs: clear to auscultation bilaterally Breasts: normal appearance, no masses or tenderness, No nipple retraction or dimpling, No nipple discharge or bleeding, No axillary adenopathy Heart: regular rate and rhythm Abdomen: soft, non-tender; no masses, no organomegaly Extremities: extremities normal, atraumatic, no cyanosis or edema Skin: skin color, texture, turgor normal. No rashes or lesions Lymph nodes: cervical, supraclavicular, and axillary nodes normal. Neurologic: grossly normal  Pelvic: External genitalia:  no lesions              No abnormal inguinal nodes palpated.              Urethra:  normal appearing urethra with no masses, tenderness or lesions               Bartholins and Skenes: normal                 Vagina: normal appearing vagina with normal color and discharge, no lesions              Cervix: no lesions              Pap taken: {yes no:314532} Bimanual Exam:  Uterus:  normal size, contour, position, consistency, mobility, non-tender              Adnexa: no mass, fullness, tenderness              Rectal exam: {yes no:314532}.  Confirms.              Anus:  normal sphincter tone, no lesions  Chaperone was present for exam:  ***  Assessment:   Well woman visit with gynecologic exam.   Plan: Mammogram screening discussed. Self breast awareness reviewed. Pap and HR HPV as above. Guidelines for Calcium, Vitamin D, regular exercise program including cardiovascular and weight bearing exercise.   Follow up annually and prn.   Additional counseling given.  {yes [id number]. _______ minutes face to face time of which over 50% was spent in counseling.    After visit summary provided.

## 2023-08-23 ENCOUNTER — Encounter: Payer: Medicare Other | Admitting: Obstetrics and Gynecology

## 2023-11-21 NOTE — Progress Notes (Signed)
73 y.o. G33P2002 [name] here for office visit. Pt wants to discuss continued vaginal discharge and odor since last visit.  The patient is also followed for lichen sclerosus, vaginal atrophy and osteopenia.  Needs refill of Clobetasol.  Insertion of the Vagifem is painful.    Not sexually active for years.   Has bleeding with bowel movements.  Has firm stools.   Reports weight gain.  Has knee pain which limits her physical activity.   Enjoying retirement.   PCP: Laqueta Due., MD   Patient's last menstrual period was 02/10/1997 (approximate).           Sexually active: No.  The current method of family planning is tubal ligation.    Menopausal hormone therapy:  estradiol vaginal tablets.  Exercising: No.   Smoker:  former  OB History     Gravida  2   Para  2   Term  2   Preterm  0   AB  0   Living  2      SAB  0   IAB  0   Ectopic  0   Multiple  0   Live Births  2           HEALTH MAINTENANCE: Last 2 paps: 08/02/22 neg, 07-22-20 Neg, 07-31-19 Neg, 04-15-18 LGSIL:Neg HR HPV(neg 16,18), 02-25-16 Neg:Neg HR HPV  History of abnormal Pap or positive HPV:  no Mammogram:  05/28/23 Breast Density Cat B, BI-RADS CAT 2 benign.  Bx 11/16/22 - Fibroadenoma of left breast.  Colonoscopy:  10/2019 normal.  Patient state she is due for colonoscopy.  Bone Density:   12-18-21  Result :Osteopenia,  FRAX:  risk of major fracture 12.9% and risk of hip fracture 1.4% during next 10 years.   Immunization History  Administered Date(s) Administered   Influenza, High Dose Seasonal PF 10/12/2017   Influenza-Unspecified 10/12/2017   PFIZER(Purple Top)SARS-COV-2 Vaccination 01/30/2020, 02/16/2020, 09/06/2020   Tdap 04/25/2006, 02/25/2016      reports that she has quit smoking. Her smoking use included cigarettes. She has never used smokeless tobacco. She reports current alcohol use of about 1.0 standard drink of alcohol per week. She reports that she does not  use drugs.  Past Medical History:  Diagnosis Date   Fractured metatarsal bone 07/07/2021   Hyperlipidemia    Hypertension    Tubular adenoma of colon 08/2002, 12 2006   Vitamin D deficiency     Past Surgical History:  Procedure Laterality Date   BREAST BIOPSY     leftr breast benign more than 20 yrs ago    BREAST BIOPSY Left 11/16/2022   [place] LT BREAST BX W LOC DEV 1ST LESION IMG BX SPEC US GUIDE 11/16/2022 [facility]   broken ankle repair Left 11/2018   CESAREAN SECTION     x2   TUBAL LIGATION Bilateral     Current Outpatient Medications  Medication Sig Dispense Refill   aspirin EC 81 MG tablet Take 81 mg by mouth daily.     calcium carbonate (SUPER CALCIUM) 1500 (600 Ca) MG TABS tablet 1 tablet with meals Orally Twice a day     Cetirizine HCl 10 MG CAPS Take 1 capsule by mouth daily.     Cholecalciferol 125 MCG (5000 UT) TABS Take by mouth.     clobetasol (TEMOVATE) 0.05 % external solution 1 application to affected area Externally--Not for face or folds once daily to scalp for 30     clobetasol ointment (TEMOVATE)  0.05 % S:Apply a pea sized amount topically qd for up to 12 weeks as needed. 30 g 0   desipramine (NORPRAMIN) 50 MG tablet Take 50 mg by mouth daily.     Estradiol 10 MCG TABS vaginal tablet Place 1 tablet (10 mcg total) vaginally 2 (two) times a week. 24 tablet 3   ezetimibe (ZETIA) 10 MG tablet Take 1 tablet by mouth daily.     hydrochlorothiazide (HYDRODIURIL) 25 MG tablet Take 25 mg by mouth daily.     losartan (COZAAR) 100 MG tablet Take 1 tablet by mouth daily.     mometasone (ELOCON) 0.1 % cream Apply 1 application topically as needed.     Multiple Vitamin (MULTIVITAMIN) tablet Take 1 tablet by mouth daily.     omeprazole (PRILOSEC) 20 MG capsule Take by mouth.     No current facility-administered medications for this visit.    ALLERGIES: Atorvastatin  Family History  Problem Relation Age of Onset   Heart disease Father    Colon cancer Mother 45    Pancreatic cancer Brother 92   Leukemia Maternal Grandfather    Breast cancer Cousin    Brain cancer Cousin     Review of Systems  All other systems reviewed and are negative.   PHYSICAL EXAM:  BP 134/80 (BP Location: Left Arm, Patient Position: Sitting, Cuff Size: Large)   Pulse 95   Ht 5' 2.75" (1.594 m)   Wt 246 lb (111.6 kg)   LMP 02/10/1997 (Approximate)   SpO2 100%   BMI 43.92 kg/m     General appearance: alert, cooperative and appears stated age Head: normocephalic, without obvious abnormality, atraumatic Neck: no adenopathy, supple, symmetrical, trachea midline and thyroid normal to inspection and palpation Lungs: clear to auscultation bilaterally Breasts: normal appearance, no masses or tenderness, No nipple retraction or dimpling, No nipple discharge or bleeding, No axillary adenopathy Heart: regular rate and rhythm Abdomen: soft, non-tender; no masses, no organomegaly Extremities: extremities normal, atraumatic, no cyanosis or edema Skin: skin color, texture, turgor normal. No rashes or lesions Lymph nodes: cervical, supraclavicular, and axillary nodes normal. Neurologic: grossly normal  Pelvic: External genitalia:  white skin change above clitoris and on the perineum.              No abnormal inguinal nodes palpated.              Urethra:  normal appearing urethra with no masses, tenderness or lesions              Bartholins and Skenes: normal                 Vagina: normal appearing vagina with normal color and yellow discharge, no lesions.  Atrophy.               Cervix: no lesions              Pap taken: No. Bimanual Exam:  Uterus:  normal size, contour, position, consistency, mobility, non-tender.  Bimanual exam limited by BMI.              Adnexa: no mass, fullness, tenderness              Rectal exam: Yes.  .  Confirms.              Anus:  normal sphincter tone, no lesions  Chaperone was present for exam:  Warren Lacy, CMA  ASSESSMENT:  Lichen  sclerosus. Vaginal atrophy.  Vaginal discharge. Encounter for medication  monitoring.  Hx LGSIL pap and negative HR HPV followed by normal paps.  Menopausal female.  Osteopenia.  FH colon and pancreatic cancer.  Constipation and rectal bleeding.   PLAN: Mammogram screening discussed. Self breast awareness reviewed. Pap and HRV collected:  No.   Due in 2025.  Guidelines for Calcium, Vitamin D, regular exercise program including cardiovascular and weight bearing exercise. Medication refills:  Clobetasol ointment.  Instructed in use. Switch from Vagifem to Estrace cream 1 gram twice weekly.  In recommended increased fiber and water in diet and a trial of a stool softener and or Miralax. She will contact her GI in [facility] her colon[facility]  Follow up:  yearly and prn.    35 min  total time was spent for this patient encounter, including preparation, face-to-face counseling with the patient, coordination of care, and documentation of the encounter in addition to doing the breast and pelvic exam.

## 2023-12-05 ENCOUNTER — Encounter: Payer: Self-pay | Admitting: Obstetrics and Gynecology

## 2023-12-05 ENCOUNTER — Other Ambulatory Visit (HOSPITAL_COMMUNITY)
Admission: RE | Admit: 2023-12-05 | Discharge: 2023-12-05 | Disposition: A | Discharge status: Home / Self Care | Payer: Medicare Other | Source: Ambulatory Visit | Attending: Obstetrics and Gynecology | Admitting: Obstetrics and Gynecology

## 2023-12-05 ENCOUNTER — Ambulatory Visit (INDEPENDENT_AMBULATORY_CARE_PROVIDER_SITE_OTHER): Payer: Medicare Other | Admitting: Obstetrics and Gynecology

## 2023-12-05 VITALS — BP 134/80 | HR 95 | Ht 62.75 in | Wt 246.0 lb

## 2023-12-05 DIAGNOSIS — M8580 Other specified disorders of bone density and structure, unspecified site: Secondary | ICD-10-CM | POA: Diagnosis not present

## 2023-12-05 DIAGNOSIS — Z780 Asymptomatic menopausal state: Secondary | ICD-10-CM

## 2023-12-05 DIAGNOSIS — N952 Postmenopausal atrophic vaginitis: Secondary | ICD-10-CM | POA: Diagnosis not present

## 2023-12-05 DIAGNOSIS — N898 Other specified noninflammatory disorders of vagina: Secondary | ICD-10-CM | POA: Diagnosis present

## 2023-12-05 DIAGNOSIS — K5900 Constipation, unspecified: Secondary | ICD-10-CM

## 2023-12-05 DIAGNOSIS — L900 Lichen sclerosus et atrophicus: Secondary | ICD-10-CM

## 2023-12-05 MED ORDER — ESTRADIOL 0.1 MG/GM VA CREA: TOPICAL_CREAM | VAGINAL | 2 refills | Status: AC

## 2023-12-05 MED ORDER — CLOBETASOL PROPIONATE 0.05 % EX OINT: TOPICAL_OINTMENT | CUTANEOUS | 1 refills | Status: AC

## 2023-12-06 ENCOUNTER — Encounter: Payer: Self-pay | Admitting: Obstetrics and Gynecology

## 2023-12-06 LAB — CERVICOVAGINAL ANCILLARY ONLY
Bacterial Vaginitis (gardnerella): NEGATIVE
Candida Glabrata: NEGATIVE
Candida Vaginitis: NEGATIVE
Comment: NEGATIVE
Comment: NEGATIVE
Comment: NEGATIVE
Comment: NEGATIVE
Trichomonas: NEGATIVE

## 2024-02-20 ENCOUNTER — Ambulatory Visit
Admission: RE | Admit: 2024-02-20 | Discharge: 2024-02-20 | Disposition: A | Discharge status: Home / Self Care | Payer: Medicare Other | Source: Ambulatory Visit | Attending: Obstetrics and Gynecology | Admitting: Obstetrics and Gynecology

## 2024-02-20 DIAGNOSIS — M8580 Other specified disorders of bone density and structure, unspecified site: Secondary | ICD-10-CM

## 2024-02-20 DIAGNOSIS — Z780 Asymptomatic menopausal state: Secondary | ICD-10-CM

## 2024-02-22 ENCOUNTER — Encounter: Payer: Self-pay | Admitting: Obstetrics and Gynecology

## 2024-07-04 ENCOUNTER — Other Ambulatory Visit: Payer: Self-pay | Admitting: Internal Medicine

## 2024-07-04 DIAGNOSIS — Z1231 Encounter for screening mammogram for malignant neoplasm of breast: Secondary | ICD-10-CM

## 2024-07-25 ENCOUNTER — Ambulatory Visit
Admission: RE | Admit: 2024-07-25 | Discharge: 2024-07-25 | Disposition: A | Discharge status: Home / Self Care | Source: Ambulatory Visit | Attending: Internal Medicine | Admitting: Internal Medicine

## 2024-07-25 DIAGNOSIS — Z1231 Encounter for screening mammogram for malignant neoplasm of breast: Secondary | ICD-10-CM
